# Patient Record
Sex: Female | Born: 1995 | Race: Black or African American | Hispanic: No | Marital: Single | Smoking: Never smoker
Health system: Southern US, Community
[De-identification: ages and names within clinical notes are randomized; demographics above are authoritative.]

---

## 2017-01-28 ENCOUNTER — Ambulatory Visit (INDEPENDENT_AMBULATORY_CARE_PROVIDER_SITE_OTHER): Payer: Medicaid Other | Admitting: Obstetrics and Gynecology

## 2017-01-28 ENCOUNTER — Encounter: Payer: Self-pay | Admitting: Obstetrics

## 2017-01-28 ENCOUNTER — Encounter: Payer: Self-pay | Admitting: Obstetrics and Gynecology

## 2017-01-28 ENCOUNTER — Other Ambulatory Visit (HOSPITAL_COMMUNITY)
Admission: RE | Admit: 2017-01-28 | Discharge: 2017-01-28 | Disposition: A | Discharge status: Home / Self Care | Payer: Medicaid Other | Source: Ambulatory Visit | Attending: Obstetrics and Gynecology | Admitting: Obstetrics and Gynecology

## 2017-01-28 DIAGNOSIS — Z3400 Encounter for supervision of normal first pregnancy, unspecified trimester: Secondary | ICD-10-CM | POA: Diagnosis not present

## 2017-01-28 DIAGNOSIS — Z3402 Encounter for supervision of normal first pregnancy, second trimester: Secondary | ICD-10-CM

## 2017-01-28 NOTE — Patient Instructions (Addendum)
 Second Trimester of Pregnancy The second trimester is from week 14 through week 27 (months 4 through 6). The second trimester is often a time when you feel your best. Your body has adjusted to being pregnant, and you begin to feel better physically. Usually, morning sickness has lessened or quit completely, you may have more energy, and you may have an increase in appetite. The second trimester is also a time when the fetus is growing rapidly. At the end of the sixth month, the fetus is about 9 inches long and weighs about 1 pounds. You will likely begin to feel the baby move (quickening) between 16 and 20 weeks of pregnancy. Body changes during your second trimester Your body continues to go through many changes during your second trimester. The changes vary from woman to woman.  Your weight will continue to increase. You will notice your lower abdomen bulging out.  You may begin to get stretch marks on your hips, abdomen, and breasts.  You may develop headaches that can be relieved by medicines. The medicines should be approved by your health care provider.  You may urinate more often because the fetus is pressing on your bladder.  You may develop or continue to have heartburn as a result of your pregnancy.  You may develop constipation because certain hormones are causing the muscles that push waste through your intestines to slow down.  You may develop hemorrhoids or swollen, bulging veins (varicose veins).  You may have back pain. This is caused by: ? Weight gain. ? Pregnancy hormones that are relaxing the joints in your pelvis. ? A shift in weight and the muscles that support your balance.  Your breasts will continue to grow and they will continue to become tender.  Your gums may bleed and may be sensitive to brushing and flossing.  Dark spots or blotches (chloasma, mask of pregnancy) may develop on your face. This will likely fade after the baby is born.  A dark line from  your belly button to the pubic area (linea nigra) may appear. This will likely fade after the baby is born.  You may have changes in your hair. These can include thickening of your hair, rapid growth, and changes in texture. Some women also have hair loss during or after pregnancy, or hair that feels dry or thin. Your hair will most likely return to normal after your baby is born.  What to expect at prenatal visits During a routine prenatal visit:  You will be weighed to make sure you and the fetus are growing normally.  Your blood pressure will be taken.  Your abdomen will be measured to track your baby's growth.  The fetal heartbeat will be listened to.  Any test results from the previous visit will be discussed.  Your health care provider may ask you:  How you are feeling.  If you are feeling the baby move.  If you have had any abnormal symptoms, such as leaking fluid, bleeding, severe headaches, or abdominal cramping.  If you are using any tobacco products, including cigarettes, chewing tobacco, and electronic cigarettes.  If you have any questions.  Other tests that may be performed during your second trimester include:  Blood tests that check for: ? Low iron levels (anemia). ? High blood sugar that affects pregnant women (gestational diabetes) between 24 and 28 weeks. ? Rh antibodies. This is to check for a protein on red blood cells (Rh factor).  Urine tests to check for infections, diabetes,   or protein in the urine.  An ultrasound to confirm the proper growth and development of the baby.  An amniocentesis to check for possible genetic problems.  Fetal screens for spina bifida and Down syndrome.  HIV (human immunodeficiency virus) testing. Routine prenatal testing includes screening for HIV, unless you choose not to have this test.  Follow these instructions at home: Medicines  Follow your health care provider's instructions regarding medicine use. Specific  medicines may be either safe or unsafe to take during pregnancy.  Take a prenatal vitamin that contains at least 600 micrograms (mcg) of folic acid.  If you develop constipation, try taking a stool softener if your health care provider approves. Eating and drinking  Eat a balanced diet that includes fresh fruits and vegetables, whole grains, good sources of protein such as meat, eggs, or tofu, and low-fat dairy. Your health care provider will help you determine the amount of weight gain that is right for you.  Avoid raw meat and uncooked cheese. These carry germs that can cause birth defects in the baby.  If you have low calcium intake from food, talk to your health care provider about whether you should take a daily calcium supplement.  Limit foods that are high in fat and processed sugars, such as fried and sweet foods.  To prevent constipation: ? Drink enough fluid to keep your urine clear or pale yellow. ? Eat foods that are high in fiber, such as fresh fruits and vegetables, whole grains, and beans. Activity  Exercise only as directed by your health care provider. Most women can continue their usual exercise routine during pregnancy. Try to exercise for 30 minutes at least 5 days a week. Stop exercising if you experience uterine contractions.  Avoid heavy lifting, wear low heel shoes, and practice good posture.  A sexual relationship may be continued unless your health care provider directs you otherwise. Relieving pain and discomfort  Wear a good support bra to prevent discomfort from breast tenderness.  Take warm sitz baths to soothe any pain or discomfort caused by hemorrhoids. Use hemorrhoid cream if your health care provider approves.  Rest with your legs elevated if you have leg cramps or low back pain.  If you develop varicose veins, wear support hose. Elevate your feet for 15 minutes, 3-4 times a day. Limit salt in your diet. Prenatal Care  Write down your questions.  Take them to your prenatal visits.  Keep all your prenatal visits as told by your health care provider. This is important. Safety  Wear your seat belt at all times when driving.  Make a list of emergency phone numbers, including numbers for family, friends, the hospital, and police and fire departments. General instructions  Ask your health care provider for a referral to a local prenatal education class. Begin classes no later than the beginning of month 6 of your pregnancy.  Ask for help if you have counseling or nutritional needs during pregnancy. Your health care provider can offer advice or refer you to specialists for help with various needs.  Do not use hot tubs, steam rooms, or saunas.  Do not douche or use tampons or scented sanitary pads.  Do not cross your legs for long periods of time.  Avoid cat litter boxes and soil used by cats. These carry germs that can cause birth defects in the baby and possibly loss of the fetus by miscarriage or stillbirth.  Avoid all smoking, herbs, alcohol, and unprescribed drugs. Chemicals in these products   can affect the formation and growth of the baby.  Do not use any products that contain nicotine or tobacco, such as cigarettes and e-cigarettes. If you need help quitting, ask your health care provider.  Visit your dentist if you have not gone yet during your pregnancy. Use a soft toothbrush to brush your teeth and be gentle when you floss. Contact a health care provider if:  You have dizziness.  You have mild pelvic cramps, pelvic pressure, or nagging pain in the abdominal area.  You have persistent nausea, vomiting, or diarrhea.  You have a bad smelling vaginal discharge.  You have pain when you urinate. Get help right away if:  You have a fever.  You are leaking fluid from your vagina.  You have spotting or bleeding from your vagina.  You have severe abdominal cramping or pain.  You have rapid weight gain or weight  loss.  You have shortness of breath with chest pain.  You notice sudden or extreme swelling of your face, hands, ankles, feet, or legs.  You have not felt your baby move in over an hour.  You have severe headaches that do not go away when you take medicine.  You have vision changes. Summary  The second trimester is from week 14 through week 27 (months 4 through 6). It is also a time when the fetus is growing rapidly.  Your body goes through many changes during pregnancy. The changes vary from woman to woman.  Avoid all smoking, herbs, alcohol, and unprescribed drugs. These chemicals affect the formation and growth your baby.  Do not use any tobacco products, such as cigarettes, chewing tobacco, and e-cigarettes. If you need help quitting, ask your health care provider.  Contact your health care provider if you have any questions. Keep all prenatal visits as told by your health care provider. This is important. This information is not intended to replace advice given to you by your health care provider. Make sure you discuss any questions you have with your health care provider. Document Released: 09/03/2001 Document Revised: 02/15/2016 Document Reviewed: 11/10/2012 [employer] Interactive Patient Education  2017 [employer]  Contraception Choices Contraception (birth control) is the use of any methods or devices to prevent pregnancy. Below are some methods to help avoid pregnancy. Hormonal methods  Contraceptive implant. This is a thin, plastic tube containing progesterone hormone. It does not contain estrogen hormone. Your health care provider inserts the tube in the inner part of the upper arm. The tube can remain in place for up to 3 years. After 3 years, the implant must be removed. The implant prevents the ovaries from releasing an egg (ovulation), thickens the cervical mucus to prevent sperm from entering the uterus, and thins the lining of the inside of the  uterus.  Progesterone-only injections. These injections are given every 3 months by your health care provider to prevent pregnancy. This synthetic progesterone hormone stops the ovaries from releasing eggs. It also thickens cervical mucus and changes the uterine lining. This makes it harder for sperm to survive in the uterus.  Birth control pills. These pills contain estrogen and progesterone hormone. They work by preventing the ovaries from releasing eggs (ovulation). They also cause the cervical mucus to thicken, preventing the sperm from entering the uterus. Birth control pills are prescribed by a health care provider.Birth control pills can also be used to treat heavy periods.  Minipill. This type of birth control pill contains only the progesterone hormone. They are taken every day   of each month and must be prescribed by your health care provider.  Birth control patch. The patch contains hormones similar to those in birth control pills. It must be changed once a week and is prescribed by a health care provider.  Vaginal ring. The ring contains hormones similar to those in birth control pills. It is left in the vagina for 3 weeks, removed for 1 week, and then a new one is put back in place. The patient must be comfortable inserting and removing the ring from the vagina.A health care provider's prescription is necessary.  Emergency contraception. Emergency contraceptives prevent pregnancy after unprotected sexual intercourse. This pill can be taken right after sex or up to 5 days after unprotected sex. It is most effective the sooner you take the pills after having sexual intercourse. Most emergency contraceptive pills are available without a prescription. Check with your pharmacist. Do not use emergency contraception as your only form of birth control. Barrier methods  Female condom. This is a thin sheath (latex or rubber) that is worn over the penis during sexual intercourse. It can be used with  spermicide to increase effectiveness.  Female condom. This is a soft, loose-fitting sheath that is put into the vagina before sexual intercourse.  Diaphragm. This is a soft, latex, dome-shaped barrier that must be fitted by a health care provider. It is inserted into the vagina, along with a spermicidal jelly. It is inserted before intercourse. The diaphragm should be left in the vagina for 6 to 8 hours after intercourse.  Cervical cap. This is a round, soft, latex or plastic cup that fits over the cervix and must be fitted by a health care provider. The cap can be left in place for up to 48 hours after intercourse.  Sponge. This is a soft, circular piece of polyurethane foam. The sponge has spermicide in it. It is inserted into the vagina after wetting it and before sexual intercourse.  Spermicides. These are chemicals that kill or block sperm from entering the cervix and uterus. They come in the form of creams, jellies, suppositories, foam, or tablets. They do not require a prescription. They are inserted into the vagina with an applicator before having sexual intercourse. The process must be repeated every time you have sexual intercourse. Intrauterine contraception  Intrauterine device (IUD). This is a T-shaped device that is put in a woman's uterus during a menstrual period to prevent pregnancy. There are 2 types: ? Copper IUD. This type of IUD is wrapped in copper wire and is placed inside the uterus. Copper makes the uterus and fallopian tubes produce a fluid that kills sperm. It can stay in place for 10 years. ? Hormone IUD. This type of IUD contains the hormone progestin (synthetic progesterone). The hormone thickens the cervical mucus and prevents sperm from entering the uterus, and it also thins the uterine lining to prevent implantation of a fertilized egg. The hormone can weaken or kill the sperm that get into the uterus. It can stay in place for 3-5 years, depending on which type of IUD  is used. Permanent methods of contraception  Female tubal ligation. This is when the woman's fallopian tubes are surgically sealed, tied, or blocked to prevent the egg from traveling to the uterus.  Hysteroscopic sterilization. This involves placing a small coil or insert into each fallopian tube. Your doctor uses a technique called hysteroscopy to do the procedure. The device causes scar tissue to form. This results in permanent blockage of the fallopian   tubes, so the sperm cannot fertilize the egg. It takes about 3 months after the procedure for the tubes to become blocked. You must use another form of birth control for these 3 months.  Female sterilization. This is when the female has the tubes that carry sperm tied off (vasectomy).This blocks sperm from entering the vagina during sexual intercourse. After the procedure, the man can still ejaculate fluid (semen). Natural planning methods  Natural family planning. This is not having sexual intercourse or using a barrier method (condom, diaphragm, cervical cap) on days the woman could become pregnant.  Calendar method. This is keeping track of the length of each menstrual cycle and identifying when you are fertile.  Ovulation method. This is avoiding sexual intercourse during ovulation.  Symptothermal method. This is avoiding sexual intercourse during ovulation, using a thermometer and ovulation symptoms.  Post-ovulation method. This is timing sexual intercourse after you have ovulated. Regardless of which type or method of contraception you choose, it is important that you use condoms to protect against the transmission of sexually transmitted infections (STIs). Talk with your health care provider about which form of contraception is most appropriate for you. This information is not intended to replace advice given to you by your health care provider. Make sure you discuss any questions you have with your health care provider. Document Released:  09/09/2005 Document Revised: 02/15/2016 Document Reviewed: 03/04/2013 [employer] Interactive Patient Education  2017 [employer]   Breastfeeding Deciding to breastfeed is one of the best choices you can make for you and your baby. A change in hormones during pregnancy causes your breast tissue to grow and increases the number and size of your milk ducts. These hormones also allow proteins, sugars, and fats from your blood supply to make breast milk in your milk-producing glands. Hormones prevent breast milk from being released before your baby is born as well as prompt milk flow after birth. Once breastfeeding has begun, thoughts of your baby, as well as his or her sucking or crying, can stimulate the release of milk from your milk-producing glands. Benefits of breastfeeding For Your Baby  Your first milk (colostrum) helps your baby's digestive system function better.  There are antibodies in your milk that help your baby fight off infections.  Your baby has a lower incidence of asthma, allergies, and sudden infant death syndrome.  The nutrients in breast milk are better for your baby than infant formulas and are designed uniquely for your baby's needs.  Breast milk improves your baby's brain development.  Your baby is less likely to develop other conditions, such as childhood obesity, asthma, or type 2 diabetes mellitus.  For You  Breastfeeding helps to create a very special bond between you and your baby.  Breastfeeding is convenient. Breast milk is always available at the correct temperature and costs nothing.  Breastfeeding helps to burn calories and helps you lose the weight gained during pregnancy.  Breastfeeding makes your uterus contract to its prepregnancy size faster and slows bleeding (lochia) after you give birth.  Breastfeeding helps to lower your risk of developing type 2 diabetes mellitus, osteoporosis, and breast or ovarian cancer later in life.  Signs that your baby  is hungry Early Signs of Hunger  Increased alertness or activity.  Stretching.  Movement of the head from side to side.  Movement of the head and opening of the mouth when the corner of the mouth or cheek is stroked (rooting).  Increased sucking sounds, smacking lips, cooing, sighing, or   squeaking.  Hand-to-mouth movements.  Increased sucking of fingers or hands.  Late Signs of Hunger  Fussing.  Intermittent crying.  Extreme Signs of Hunger Signs of extreme hunger will require calming and consoling before your baby will be able to breastfeed successfully. Do not wait for the following signs of extreme hunger to occur before you initiate breastfeeding:  Restlessness.  A loud, strong cry.  Screaming.  Breastfeeding basics Breastfeeding Initiation  Find a comfortable place to sit or lie down, with your neck and back well supported.  Place a pillow or rolled up blanket under your baby to bring him or her to the level of your breast (if you are seated). Nursing pillows are specially designed to help support your arms and your baby while you breastfeed.  Make sure that your baby's abdomen is facing your abdomen.  Gently massage your breast. With your fingertips, massage from your chest wall toward your nipple in a circular motion. This encourages milk flow. You may need to continue this action during the feeding if your milk flows slowly.  Support your breast with 4 fingers underneath and your thumb above your nipple. Make sure your fingers are well away from your nipple and your baby's mouth.  Stroke your baby's lips gently with your finger or nipple.  When your baby's mouth is open wide enough, quickly bring your baby to your breast, placing your entire nipple and as much of the colored area around your nipple (areola) as possible into your baby's mouth. ? More areola should be visible above your baby's upper lip than below the lower lip. ? Your baby's tongue should be  between his or her lower gum and your breast.  Ensure that your baby's mouth is correctly positioned around your nipple (latched). Your baby's lips should create a seal on your breast and be turned out (everted).  It is common for your baby to suck about 2-3 minutes in order to start the flow of breast milk.  Latching Teaching your baby how to latch on to your breast properly is very important. An improper latch can cause nipple pain and decreased milk supply for you and poor weight gain in your baby. Also, if your baby is not latched onto your nipple properly, he or she may swallow some air during feeding. This can make your baby fussy. Burping your baby when you switch breasts during the feeding can help to get rid of the air. However, teaching your baby to latch on properly is still the best way to prevent fussiness from swallowing air while breastfeeding. Signs that your baby has successfully latched on to your nipple:  Silent tugging or silent sucking, without causing you pain.  Swallowing heard between every 3-4 sucks.  Muscle movement above and in front of his or her ears while sucking.  Signs that your baby has not successfully latched on to nipple:  Sucking sounds or smacking sounds from your baby while breastfeeding.  Nipple pain.  If you think your baby has not latched on correctly, slip your finger into the corner of your baby's mouth to break the suction and place it between your baby's gums. Attempt breastfeeding initiation again. Signs of Successful Breastfeeding Signs from your baby:  A gradual decrease in the number of sucks or complete cessation of sucking.  Falling asleep.  Relaxation of his or her body.  Retention of a small amount of milk in his or her mouth.  Letting go of your breast by himself or herself.    Signs from you:  Breasts that have increased in firmness, weight, and size 1-3 hours after feeding.  Breasts that are softer immediately after  breastfeeding.  Increased milk volume, as well as a change in milk consistency and color by the fifth day of breastfeeding.  Nipples that are not sore, cracked, or bleeding.  Signs That Your Baby is Getting Enough Milk  Wetting at least 1-2 diapers during the first 24 hours after birth.  Wetting at least 5-6 diapers every 24 hours for the first week after birth. The urine should be clear or pale yellow by 5 days after birth.  Wetting 6-8 diapers every 24 hours as your baby continues to grow and develop.  At least 3 stools in a 24-hour period by age 5 days. The stool should be soft and yellow.  At least 3 stools in a 24-hour period by age 7 days. The stool should be seedy and yellow.  No loss of weight greater than 10% of birth weight during the first 3 days of age.  Average weight gain of 4-7 ounces (113-198 g) per week after age 4 days.  Consistent daily weight gain by age 5 days, without weight loss after the age of 2 weeks.  After a feeding, your baby may spit up a small amount. This is common. Breastfeeding frequency and duration Frequent feeding will help you make more milk and can prevent sore nipples and breast engorgement. Breastfeed when you feel the need to reduce the fullness of your breasts or when your baby shows signs of hunger. This is called "breastfeeding on demand." Avoid introducing a pacifier to your baby while you are working to establish breastfeeding (the first 4-6 weeks after your baby is born). After this time you may choose to use a pacifier. Research has shown that pacifier use during the first year of a baby's life decreases the risk of sudden infant death syndrome (SIDS). Allow your baby to feed on each breast as long as he or she wants. Breastfeed until your baby is finished feeding. When your baby unlatches or falls asleep while feeding from the first breast, offer the second breast. Because newborns are often sleepy in the first few weeks of life, you may  need to awaken your baby to get him or her to feed. Breastfeeding times will vary from baby to baby. However, the following rules can serve as a guide to help you ensure that your baby is properly fed:  Newborns (babies 4 weeks of age or younger) may breastfeed every 1-3 hours.  Newborns should not go longer than 3 hours during the day or 5 hours during the night without breastfeeding.  You should breastfeed your baby a minimum of 8 times in a 24-hour period until you begin to introduce solid foods to your baby at around 6 months of age.  Breast milk pumping Pumping and storing breast milk allows you to ensure that your baby is exclusively fed your breast milk, even at times when you are unable to breastfeed. This is especially important if you are going back to work while you are still breastfeeding or when you are not able to be present during feedings. Your lactation consultant can give you guidelines on how long it is safe to store breast milk. A breast pump is a machine that allows you to pump milk from your breast into a sterile bottle. The pumped breast milk can then be stored in a refrigerator or freezer. Some breast pumps are operated by   hand, while others use electricity. Ask your lactation consultant which type will work best for you. Breast pumps can be purchased, but some hospitals and breastfeeding support groups lease breast pumps on a monthly basis. A lactation consultant can teach you how to hand express breast milk, if you prefer not to use a pump. Caring for your breasts while you breastfeed Nipples can become dry, cracked, and sore while breastfeeding. The following recommendations can help keep your breasts moisturized and healthy:  Avoid using soap on your nipples.  Wear a supportive bra. Although not required, special nursing bras and tank tops are designed to allow access to your breasts for breastfeeding without taking off your entire bra or top. Avoid wearing  underwire-style bras or extremely tight bras.  Air dry your nipples for 3-4minutes after each feeding.  Use only cotton bra pads to absorb leaked breast milk. Leaking of breast milk between feedings is normal.  Use lanolin on your nipples after breastfeeding. Lanolin helps to maintain your skin's normal moisture barrier. If you use pure lanolin, you do not need to wash it off before feeding your baby again. Pure lanolin is not toxic to your baby. You may also hand express a few drops of breast milk and gently massage that milk into your nipples and allow the milk to air dry.  In the first few weeks after giving birth, some women experience extremely full breasts (engorgement). Engorgement can make your breasts feel heavy, warm, and tender to the touch. Engorgement peaks within 3-5 days after you give birth. The following recommendations can help ease engorgement:  Completely empty your breasts while breastfeeding or pumping. You may want to start by applying warm, moist heat (in the shower or with warm water-soaked hand towels) just before feeding or pumping. This increases circulation and helps the milk flow. If your baby does not completely empty your breasts while breastfeeding, pump any extra milk after he or she is finished.  Wear a snug bra (nursing or regular) or tank top for 1-2 days to signal your body to slightly decrease milk production.  Apply ice packs to your breasts, unless this is too uncomfortable for you.  Make sure that your baby is latched on and positioned properly while breastfeeding.  If engorgement persists after 48 hours of following these recommendations, contact your health care provider or a lactation consultant. Overall health care recommendations while breastfeeding  Eat healthy foods. Alternate between meals and snacks, eating 3 of each per day. Because what you eat affects your breast milk, some of the foods may make your baby more irritable than usual. Avoid  eating these foods if you are sure that they are negatively affecting your baby.  Drink milk, fruit juice, and water to satisfy your thirst (about 10 glasses a day).  Rest often, relax, and continue to take your prenatal vitamins to prevent fatigue, stress, and anemia.  Continue breast self-awareness checks.  Avoid chewing and smoking tobacco. Chemicals from cigarettes that pass into breast milk and exposure to secondhand smoke may harm your baby.  Avoid alcohol and drug use, including marijuana. Some medicines that may be harmful to your baby can pass through breast milk. It is important to ask your health care provider before taking any medicine, including all over-the-counter and prescription medicine as well as vitamin and herbal supplements. It is possible to become pregnant while breastfeeding. If birth control is desired, ask your health care provider about options that will be safe for your baby. Contact   a health care provider if:  You feel like you want to stop breastfeeding or have become frustrated with breastfeeding.  You have painful breasts or nipples.  Your nipples are cracked or bleeding.  Your breasts are red, tender, or warm.  You have a swollen area on either breast.  You have a fever or chills.  You have nausea or vomiting.  You have drainage other than breast milk from your nipples.  Your breasts do not become full before feedings by the fifth day after you give birth.  You feel sad and depressed.  Your baby is too sleepy to eat well.  Your baby is having trouble sleeping.  Your baby is wetting less than 3 diapers in a 24-hour period.  Your baby has less than 3 stools in a 24-hour period.  Your baby's skin or the white part of his or her eyes becomes yellow.  Your baby is not gaining weight by 5 days of age. Get help right away if:  Your baby is overly tired (lethargic) and does not want to wake up and feed.  Your baby develops an unexplained  fever. This information is not intended to replace advice given to you by your health care provider. Make sure you discuss any questions you have with your health care provider. Document Released: 09/09/2005 Document Revised: 02/21/2016 Document Reviewed: 03/03/2013 Elsevier Interactive Patient Education  2017 [employer]  

## 2017-01-28 NOTE — Progress Notes (Signed)
  Subjective:    Shelby Woods is a G1P0 7139w5d being seen today for her first obstetrical visit.  Her obstetrical history is significant for teen pregnancy. Patient does intend to breast feed. Pregnancy history fully reviewed.  Patient reports no complaints.  Vitals:   01/28/17 1427 01/28/17 1431  BP: 108/67   Pulse: 92   Temp: 98.7 F (37.1 C)   Weight: 116 lb 8 oz (52.8 kg)   Height:  5\' 2"  (1.575 m)    HISTORY: OB History  Gravida Para Term Preterm AB Living  1            SAB TAB Ectopic Multiple Live Births               # Outcome Date GA Lbr Len/2nd Weight Sex Delivery Anes PTL Lv  1 Current              History reviewed. No pertinent past medical history. History reviewed. No pertinent surgical history. History reviewed. No pertinent family history.   Exam    Uterus:     Pelvic Exam:    Perineum: No Hemorrhoids, Normal Perineum   Vulva: normal   Vagina:  normal mucosa, normal discharge   pH:    Cervix: nulliparous appearance and cervix is closed and long   Adnexa: no mass, fullness, tenderness   Bony Pelvis: gynecoid  System: Breast:  normal appearance, no masses or tenderness   Skin: normal coloration and turgor, no rashes    Neurologic: oriented, no focal deficits   Extremities: normal strength, tone, and muscle mass   HEENT extra ocular movement intact   Mouth/Teeth mucous membranes moist, pharynx normal without lesions and dental hygiene good   Neck supple and no masses   Cardiovascular: regular rate and rhythm   Respiratory:  chest clear, no wheezing, crepitations, rhonchi, normal symmetric air entry   Abdomen: soft, non-tender; bowel sounds normal; no masses,  no organomegaly   Urinary:       Assessment:    Pregnancy: G1P0 Patient Active Problem List   Diagnosis Date Noted  . Supervision of normal first teen pregnancy 01/28/2017        Plan:     Initial labs drawn. Prenatal vitamins. Problem list reviewed and updated. Genetic  Screening discussed Quad Screen: requested. Will be collected at next visit  Ultrasound discussed; fetal survey: ordered. Patient works at [employer]. Advised to continue working until told otherwise. Work restriction letter provided  Follow up in 4 weeks. 50% of 30 min visit spent on counseling and coordination of care.     Rahmon Heigl 01/28/2017

## 2017-01-29 LAB — CERVICOVAGINAL ANCILLARY ONLY
BACTERIAL VAGINITIS: NEGATIVE
Candida vaginitis: NEGATIVE
Chlamydia: NEGATIVE
NEISSERIA GONORRHEA: NEGATIVE
Trichomonas: NEGATIVE

## 2017-01-30 LAB — CULTURE, OB URINE

## 2017-01-30 LAB — URINE CULTURE, OB REFLEX

## 2017-02-03 LAB — HEMOGLOBINOPATHY EVALUATION
HGB A: 97.7 % (ref 96.4–98.8)
HGB C: 0 %
HGB S: 0 %
HGB VARIANT: 0 %
Hemoglobin A2 Quantitation: 2.3 % (ref 1.8–3.2)
Hemoglobin F Quantitation: 0 % (ref 0.0–2.0)

## 2017-02-03 LAB — OBSTETRIC PANEL, INCLUDING HIV
Antibody Screen: NEGATIVE
BASOS ABS: 0 10*3/uL (ref 0.0–0.2)
Basos: 0 %
EOS (ABSOLUTE): 0 10*3/uL (ref 0.0–0.4)
Eos: 1 %
HEMOGLOBIN: 13.7 g/dL (ref 11.1–15.9)
HEP B S AG: NEGATIVE
HIV Screen 4th Generation wRfx: NONREACTIVE
Hematocrit: 40.2 % (ref 34.0–46.6)
IMMATURE GRANS (ABS): 0 10*3/uL (ref 0.0–0.1)
IMMATURE GRANULOCYTES: 0 %
LYMPHS ABS: 1.6 10*3/uL (ref 0.7–3.1)
LYMPHS: 26 %
MCH: 33.3 pg — ABNORMAL HIGH (ref 26.6–33.0)
MCHC: 34.1 g/dL (ref 31.5–35.7)
MCV: 98 fL — ABNORMAL HIGH (ref 79–97)
MONOCYTES: 6 %
Monocytes Absolute: 0.3 10*3/uL (ref 0.1–0.9)
NEUTROS PCT: 67 %
Neutrophils Absolute: 4.2 10*3/uL (ref 1.4–7.0)
PLATELETS: 206 10*3/uL (ref 150–379)
RBC: 4.12 x10E6/uL (ref 3.77–5.28)
RDW: 13.1 % (ref 12.3–15.4)
RPR: NONREACTIVE
Rh Factor: POSITIVE
Rubella Antibodies, IGG: 5.66 index (ref >0.99)
WBC: 6.2 10*3/uL (ref 3.4–10.8)

## 2017-02-03 LAB — COMPREHENSIVE METABOLIC PANEL
A/G RATIO: 1.4 (ref 1.2–2.2)
ALBUMIN: 4 g/dL (ref 3.5–5.5)
ALT: 8 IU/L (ref 0–32)
AST: 16 IU/L (ref 0–40)
Alkaline Phosphatase: 46 IU/L (ref 39–117)
BUN / CREAT RATIO: 9 (ref 9–23)
BUN: 5 mg/dL — AB (ref 6–20)
Bilirubin Total: 0.3 mg/dL (ref 0.0–1.2)
CALCIUM: 9.1 mg/dL (ref 8.7–10.2)
CO2: 21 mmol/L (ref 18–29)
Chloride: 99 mmol/L (ref 96–106)
Creatinine, Ser: 0.58 mg/dL (ref 0.57–1.00)
GFR, EST AFRICAN AMERICAN: 155 mL/min/{1.73_m2} (ref >59)
GFR, EST NON AFRICAN AMERICAN: 134 mL/min/{1.73_m2} (ref >59)
GLUCOSE: 74 mg/dL (ref 65–99)
Globulin, Total: 2.8 g/dL (ref 1.5–4.5)
Potassium: 4.4 mmol/L (ref 3.5–5.2)
Sodium: 136 mmol/L (ref 134–144)
TOTAL PROTEIN: 6.8 g/dL (ref 6.0–8.5)

## 2017-02-03 LAB — VARICELLA ZOSTER ANTIBODY, IGG: VARICELLA: 345 {index} (ref >165)

## 2017-02-03 LAB — HEMOGLOBIN A1C
Est. average glucose Bld gHb Est-mCnc: 100 mg/dL
Hgb A1c MFr Bld: 5.1 % (ref 4.8–5.6)

## 2017-02-03 LAB — CYSTIC FIBROSIS MUTATION 97: Interpretation: NOT DETECTED

## 2017-02-25 ENCOUNTER — Ambulatory Visit (INDEPENDENT_AMBULATORY_CARE_PROVIDER_SITE_OTHER): Payer: Self-pay | Admitting: Obstetrics and Gynecology

## 2017-02-25 DIAGNOSIS — Z3400 Encounter for supervision of normal first pregnancy, unspecified trimester: Secondary | ICD-10-CM

## 2017-02-25 DIAGNOSIS — Z3482 Encounter for supervision of other normal pregnancy, second trimester: Secondary | ICD-10-CM

## 2017-02-25 MED ORDER — COMFORT FIT MATERNITY SUPP MED MISC: 0 refills | Status: AC

## 2017-02-25 NOTE — Progress Notes (Signed)
   PRENATAL VISIT NOTE  Subjective:  Shelby Woods is a 21 y.o. G1P0 at [redacted]w[redacted]d being seen today for ongoing prenatal care.  She is currently monitored for the following issues for this low-risk pregnancy and has Supervision of normal first teen pregnancy on her problem list.  Patient reports no complaints.  Contractions: Not present. Vag. Bleeding: None.  Movement: Present. Denies leaking of fluid.   The following portions of the patient's history were reviewed and updated as appropriate: allergies, current medications, past family history, past medical history, past social history, past surgical history and problem list. Problem list updated.  Objective:   Vitals:   02/25/17 1534  BP: 105/67  Pulse: 86  Weight: 116 lb 12.8 oz (53 kg)    Fetal Status: Fetal Heart Rate (bpm): 151   Movement: Present     General:  Alert, oriented and cooperative. Patient is in no acute distress.  Skin: Skin is warm and dry. No rash noted.   Cardiovascular: Normal heart rate noted  Respiratory: Normal respiratory effort, no problems with respiration noted  Abdomen: Soft, gravid, appropriate for gestational age. Pain/Pressure: Absent     Pelvic:  Cervical exam deferred        Extremities: Normal range of motion.  Edema: Trace  Mental Status: Normal mood and affect. Normal behavior. Normal judgment and thought content.   Assessment and Plan:  Pregnancy: G1P0 at 2242w5d  1. Encounter for supervision of normal pregnancy in teen primigravida, antepartum Patient is doing well without complaints She reports some lower back discomfort after working long days. Discussed incorporating Yoga or stretching in her routine. Support belt provided Anatomy ultrasound on 6/7 Quad screen today - AFP TETRA  General obstetric precautions including but not limited to vaginal bleeding, contractions, leaking of fluid and fetal movement were reviewed in detail with the patient. Please refer to After Visit Summary for other  counseling recommendations.  Return in about 4 weeks (around 03/25/2017) for ROB.   Catalina AntiguaPeggy Viney Acocella, MD

## 2017-02-27 ENCOUNTER — Other Ambulatory Visit: Payer: Self-pay | Admitting: Obstetrics and Gynecology

## 2017-02-27 ENCOUNTER — Ambulatory Visit (HOSPITAL_COMMUNITY)
Admission: RE | Admit: 2017-02-27 | Discharge: 2017-02-27 | Disposition: A | Discharge status: Home / Self Care | Payer: Medicaid Other | Source: Ambulatory Visit | Attending: Obstetrics and Gynecology | Admitting: Obstetrics and Gynecology

## 2017-02-27 ENCOUNTER — Encounter (HOSPITAL_COMMUNITY): Payer: Self-pay | Admitting: Radiology

## 2017-02-27 DIAGNOSIS — Z3400 Encounter for supervision of normal first pregnancy, unspecified trimester: Secondary | ICD-10-CM

## 2017-02-27 DIAGNOSIS — O283 Abnormal ultrasonic finding on antenatal screening of mother: Secondary | ICD-10-CM | POA: Insufficient documentation

## 2017-02-27 DIAGNOSIS — Z3A19 19 weeks gestation of pregnancy: Secondary | ICD-10-CM

## 2017-02-27 DIAGNOSIS — Z363 Encounter for antenatal screening for malformations: Secondary | ICD-10-CM | POA: Diagnosis not present

## 2017-02-28 LAB — AFP TETRA
DIA Mom Value: 0.42
DIA VALUE (EIA): 85 pg/mL
DSR (BY AGE) 1 IN: 1166
DSR (SECOND TRIMESTER) 1 IN: 10000
Gestational Age: 18.5 WEEKS
MSAFP MOM: 0.82
MSAFP: 49.1 ng/mL
MSHCG Mom: 0.39
MSHCG: 12104 m[IU]/mL
Maternal Age At EDD: 19.8 yr
Osb Risk: 10000
TEST RESULTS AFP: NEGATIVE
WEIGHT: 116 [lb_av]
uE3 Mom: 1.19
uE3 Value: 1.82 ng/mL

## 2017-03-25 ENCOUNTER — Ambulatory Visit (INDEPENDENT_AMBULATORY_CARE_PROVIDER_SITE_OTHER): Payer: Self-pay | Admitting: Obstetrics and Gynecology

## 2017-03-25 VITALS — BP 96/61 | HR 87 | Wt 120.0 lb

## 2017-03-25 DIAGNOSIS — Z3402 Encounter for supervision of normal first pregnancy, second trimester: Secondary | ICD-10-CM

## 2017-03-25 NOTE — Progress Notes (Signed)
Subjective:  Shelby Woods is a 21 y.o. G2P0010 at 7464w5d being seen today for ongoing prenatal care.  She is currently monitored for the following issues for this low-risk pregnancy and has Supervision of normal first teen pregnancy on her problem list.  Patient reports no complaints.  Contractions: Not present. Vag. Bleeding: None.  Movement: Present. Denies leaking of fluid.   The following portions of the patient's history were reviewed and updated as appropriate: allergies, current medications, past family history, past medical history, past social history, past surgical history and problem list. Problem list updated.  Objective:   Vitals:   03/25/17 1005  BP: 96/61  Pulse: 87  Weight: 120 lb (54.4 kg)    Fetal Status: Fetal Heart Rate (bpm): 150   Movement: Present     General:  Alert, oriented and cooperative. Patient is in no acute distress.  Skin: Skin is warm and dry. No rash noted.   Cardiovascular: Normal heart rate noted  Respiratory: Normal respiratory effort, no problems with respiration noted  Abdomen: Soft, gravid, appropriate for gestational age. Pain/Pressure: Absent     Pelvic:  Cervical exam deferred        Extremities: Normal range of motion.  Edema: None  Mental Status: Normal mood and affect. Normal behavior. Normal judgment and thought content.   Urinalysis:      Assessment and Plan:  Pregnancy: G2P0010 at 5864w5d  1. Supervision of normal first teen pregnancy in second trimester Stable Glucola next visit F/U U/S scheduled  Preterm labor symptoms and general obstetric precautions including but not limited to vaginal bleeding, contractions, leaking of fluid and fetal movement were reviewed in detail with the patient. Please refer to After Visit Summary for other counseling recommendations.  Return in about 4 weeks (around 04/22/2017) for OB visit.   Shelby Woods, Shelby Mazzoni L, MD

## 2017-04-01 ENCOUNTER — Other Ambulatory Visit: Payer: Self-pay | Admitting: Obstetrics and Gynecology

## 2017-04-01 ENCOUNTER — Ambulatory Visit (HOSPITAL_COMMUNITY)
Admission: RE | Admit: 2017-04-01 | Discharge: 2017-04-01 | Disposition: A | Discharge status: Home / Self Care | Payer: Medicaid Other | Source: Ambulatory Visit | Attending: Obstetrics and Gynecology | Admitting: Obstetrics and Gynecology

## 2017-04-01 DIAGNOSIS — Z3686 Encounter for antenatal screening for cervical length: Secondary | ICD-10-CM

## 2017-04-01 DIAGNOSIS — Z3A23 23 weeks gestation of pregnancy: Secondary | ICD-10-CM

## 2017-04-01 DIAGNOSIS — Z0489 Encounter for examination and observation for other specified reasons: Secondary | ICD-10-CM

## 2017-04-01 DIAGNOSIS — IMO0002 Reserved for concepts with insufficient information to code with codable children: Secondary | ICD-10-CM

## 2017-04-01 DIAGNOSIS — Z3400 Encounter for supervision of normal first pregnancy, unspecified trimester: Secondary | ICD-10-CM

## 2017-04-01 DIAGNOSIS — Z362 Encounter for other antenatal screening follow-up: Secondary | ICD-10-CM | POA: Diagnosis present

## 2017-04-22 ENCOUNTER — Other Ambulatory Visit: Payer: Medicaid Other

## 2017-04-22 ENCOUNTER — Ambulatory Visit (INDEPENDENT_AMBULATORY_CARE_PROVIDER_SITE_OTHER): Payer: Self-pay | Admitting: Obstetrics and Gynecology

## 2017-04-22 DIAGNOSIS — O358XX0 Maternal care for other (suspected) fetal abnormality and damage, not applicable or unspecified: Secondary | ICD-10-CM

## 2017-04-22 DIAGNOSIS — Z3400 Encounter for supervision of normal first pregnancy, unspecified trimester: Secondary | ICD-10-CM

## 2017-04-22 DIAGNOSIS — Z3482 Encounter for supervision of other normal pregnancy, second trimester: Secondary | ICD-10-CM

## 2017-04-22 DIAGNOSIS — IMO0002 Reserved for concepts with insufficient information to code with codable children: Secondary | ICD-10-CM | POA: Insufficient documentation

## 2017-04-22 DIAGNOSIS — IMO0001 Reserved for inherently not codable concepts without codable children: Secondary | ICD-10-CM

## 2017-04-22 NOTE — Progress Notes (Signed)
OB f/u US scheduled for August 17th @ 1015.  Pt notified.

## 2017-04-22 NOTE — Addendum Note (Signed)
Addended by: Faythe CasaBELLAMY, JEANETTA M on: 04/22/2017 09:21 AM   Modules accepted: Orders

## 2017-04-22 NOTE — Progress Notes (Signed)
Pt presents for 2 gtt; declines tdap today.

## 2017-04-22 NOTE — Patient Instructions (Signed)

## 2017-04-22 NOTE — Progress Notes (Signed)
Subjective:  Shelby Woods is a 21 y.o. G2P0010 at 837w5d being seen today for ongoing prenatal care.  She is currently monitored for the following issues for this low-risk pregnancy and has Supervision of normal first teen pregnancy and Fetal cardiac echogenic focus on her problem list.  Patient reports no complaints.  Contractions: Irregular. Vag. Bleeding: None.  Movement: Present. Denies leaking of fluid.   The following portions of the patient's history were reviewed and updated as appropriate: allergies, current medications, past family history, past medical history, past social history, past surgical history and problem list. Problem list updated.  Objective:   Vitals:   04/22/17 0856  BP: 107/67  Pulse: 86  Weight: 125 lb (56.7 kg)    Fetal Status: Fetal Heart Rate (bpm): 130   Movement: Present     General:  Alert, oriented and cooperative. Patient is in no acute distress.  Skin: Skin is warm and dry. No rash noted.   Cardiovascular: Normal heart rate noted  Respiratory: Normal respiratory effort, no problems with respiration noted  Abdomen: Soft, gravid, appropriate for gestational age. Pain/Pressure: Absent     Pelvic:  Cervical exam deferred        Extremities: Normal range of motion.  Edema: None  Mental Status: Normal mood and affect. Normal behavior. Normal judgment and thought content.   Urinalysis:      Assessment and Plan:  Pregnancy: G2P0010 at 657w5d  1. Encounter for supervision of normal pregnancy in teen primigravida, antepartum Stable - Glucose Tolerance, 2 Hours w/1 Hour - CBC - HIV antibody - RPR - US MFM OB COMP + 14 WK; Future  2. Fetal cardiac echogenic focus, single or unspecified fetus Nl Quad screen F/U U/S scheduled  Preterm labor symptoms and general obstetric precautions including but not limited to vaginal bleeding, contractions, leaking of fluid and fetal movement were reviewed in detail with the patient. Please refer to After Visit  Summary for other counseling recommendations.  Return in about 3 weeks (around 05/13/2017) for OB visit.   Hermina StaggersErvin, Taressa Rauh L, MD

## 2017-04-23 LAB — GLUCOSE TOLERANCE, 2 HOURS W/ 1HR
GLUCOSE, 1 HOUR: 108 mg/dL (ref 65–179)
GLUCOSE, FASTING: 77 mg/dL (ref 65–91)
Glucose, 2 hour: 100 mg/dL (ref 65–152)

## 2017-04-23 LAB — CBC
Hematocrit: 34.9 % (ref 34.0–46.6)
Hemoglobin: 12 g/dL (ref 11.1–15.9)
MCH: 33.5 pg — AB (ref 26.6–33.0)
MCHC: 34.4 g/dL (ref 31.5–35.7)
MCV: 98 fL — AB (ref 79–97)
PLATELETS: 192 10*3/uL (ref 150–379)
RBC: 3.58 x10E6/uL — AB (ref 3.77–5.28)
RDW: 13.3 % (ref 12.3–15.4)
WBC: 7 10*3/uL (ref 3.4–10.8)

## 2017-04-23 LAB — RPR: RPR Ser Ql: NONREACTIVE

## 2017-04-23 LAB — HIV ANTIBODY (ROUTINE TESTING W REFLEX): HIV SCREEN 4TH GENERATION: NONREACTIVE

## 2017-04-30 ENCOUNTER — Encounter: Payer: Self-pay | Admitting: Obstetrics and Gynecology

## 2017-05-09 ENCOUNTER — Ambulatory Visit (HOSPITAL_COMMUNITY)
Admission: RE | Admit: 2017-05-09 | Discharge: 2017-05-09 | Disposition: A | Discharge status: Home / Self Care | Payer: Medicaid Other | Source: Ambulatory Visit | Attending: Obstetrics and Gynecology | Admitting: Obstetrics and Gynecology

## 2017-05-09 ENCOUNTER — Other Ambulatory Visit: Payer: Self-pay | Admitting: Obstetrics and Gynecology

## 2017-05-09 DIAGNOSIS — O358XX0 Maternal care for other (suspected) fetal abnormality and damage, not applicable or unspecified: Secondary | ICD-10-CM

## 2017-05-09 DIAGNOSIS — Z362 Encounter for other antenatal screening follow-up: Secondary | ICD-10-CM

## 2017-05-09 DIAGNOSIS — Z3A29 29 weeks gestation of pregnancy: Secondary | ICD-10-CM | POA: Diagnosis not present

## 2017-05-09 DIAGNOSIS — IMO0001 Reserved for inherently not codable concepts without codable children: Secondary | ICD-10-CM

## 2017-05-09 DIAGNOSIS — Z3400 Encounter for supervision of normal first pregnancy, unspecified trimester: Secondary | ICD-10-CM

## 2017-05-19 ENCOUNTER — Ambulatory Visit (INDEPENDENT_AMBULATORY_CARE_PROVIDER_SITE_OTHER): Payer: Medicaid Other | Admitting: Advanced Practice Midwife

## 2017-05-19 ENCOUNTER — Encounter: Payer: Self-pay | Admitting: Advanced Practice Midwife

## 2017-05-19 DIAGNOSIS — Z3400 Encounter for supervision of normal first pregnancy, unspecified trimester: Secondary | ICD-10-CM

## 2017-05-19 NOTE — Patient Instructions (Signed)
Third Trimester of Pregnancy The third trimester is from week 28 through week 40 (months 7 through 9). The third trimester is a time when the unborn baby (fetus) is growing rapidly. At the end of the ninth month, the fetus is about 20 inches in length and weighs 6-10 pounds. Body changes during your third trimester Your body will continue to go through many changes during pregnancy. The changes vary from woman to woman. During the third trimester:  Your weight will continue to increase. You can expect to gain 25-35 pounds (11-16 kg) by the end of the pregnancy.  You may begin to get stretch marks on your hips, abdomen, and breasts.  You may urinate more often because the fetus is moving lower into your pelvis and pressing on your bladder.  You may develop or continue to have heartburn. This is caused by increased hormones that slow down muscles in the digestive tract.  You may develop or continue to have constipation because increased hormones slow digestion and cause the muscles that push waste through your intestines to relax.  You may develop hemorrhoids. These are swollen veins (varicose veins) in the rectum that can itch or be painful.  You may develop swollen, bulging veins (varicose veins) in your legs.  You may have increased body aches in the pelvis, back, or thighs. This is due to weight gain and increased hormones that are relaxing your joints.  You may have changes in your hair. These can include thickening of your hair, rapid growth, and changes in texture. Some women also have hair loss during or after pregnancy, or hair that feels dry or thin. Your hair will most likely return to normal after your baby is born.  Your breasts will continue to grow and they will continue to become tender. A yellow fluid (colostrum) may leak from your breasts. This is the first milk you are producing for your baby.  Your belly button may stick out.  You may notice more swelling in your hands,  face, or ankles.  You may have increased tingling or numbness in your hands, arms, and legs. The skin on your belly may also feel numb.  You may feel short of breath because of your expanding uterus.  You may have more problems sleeping. This can be caused by the size of your belly, increased need to urinate, and an increase in your body's metabolism.  You may notice the fetus "dropping," or moving lower in your abdomen (lightening).  You may have increased vaginal discharge.  You may notice your joints feel loose and you may have pain around your pelvic bone.  What to expect at prenatal visits You will have prenatal exams every 2 weeks until week 36. Then you will have weekly prenatal exams. During a routine prenatal visit:  You will be weighed to make sure you and the baby are growing normally.  Your blood pressure will be taken.  Your abdomen will be measured to track your baby's growth.  The fetal heartbeat will be listened to.  Any test results from the previous visit will be discussed.  You may have a cervical check near your due date to see if your cervix has softened or thinned (effaced).  You will be tested for Group B streptococcus. This happens between 35 and 37 weeks.  Your health care provider may ask you:  What your birth plan is.  How you are feeling.  If you are feeling the baby move.  If you have had   any abnormal symptoms, such as leaking fluid, bleeding, severe headaches, or abdominal cramping.  If you are using any tobacco products, including cigarettes, chewing tobacco, and electronic cigarettes.  If you have any questions.  Other tests or screenings that may be performed during your third trimester include:  Blood tests that check for low iron levels (anemia).  Fetal testing to check the health, activity level, and growth of the fetus. Testing is done if you have certain medical conditions or if there are problems during the  pregnancy.  Nonstress test (NST). This test checks the health of your baby to make sure there are no signs of problems, such as the baby not getting enough oxygen. During this test, a belt is placed around your belly. The baby is made to move, and its heart rate is monitored during movement.  What is false labor? False labor is a condition in which you feel small, irregular tightenings of the muscles in the womb (contractions) that usually go away with rest, changing position, or drinking water. These are called Braxton Hicks contractions. Contractions may last for hours, days, or even weeks before true labor sets in. If contractions come at regular intervals, become more frequent, increase in intensity, or become painful, you should see your health care provider. What are the signs of labor?  Abdominal cramps.  Regular contractions that start at 10 minutes apart and become stronger and more frequent with time.  Contractions that start on the top of the uterus and spread down to the lower abdomen and back.  Increased pelvic pressure and dull back pain.  A watery or bloody mucus discharge that comes from the vagina.  Leaking of amniotic fluid. This is also known as your "water breaking." It could be a slow trickle or a gush. Let your health care provider know if it has a color or strange odor. If you have any of these signs, call your health care provider right away, even if it is before your due date. Follow these instructions at home: Medicines  Follow your health care provider's instructions regarding medicine use. Specific medicines may be either safe or unsafe to take during pregnancy.  Take a prenatal vitamin that contains at least 600 micrograms (mcg) of folic acid.  If you develop constipation, try taking a stool softener if your health care provider approves. Eating and drinking  Eat a balanced diet that includes fresh fruits and vegetables, whole grains, good sources of protein  such as meat, eggs, or tofu, and low-fat dairy. Your health care provider will help you determine the amount of weight gain that is right for you.  Avoid raw meat and uncooked cheese. These carry germs that can cause birth defects in the baby.  If you have low calcium intake from food, talk to your health care provider about whether you should take a daily calcium supplement.  Eat four or five small meals rather than three large meals a day.  Limit foods that are high in fat and processed sugars, such as fried and sweet foods.  To prevent constipation: ? Drink enough fluid to keep your urine clear or pale yellow. ? Eat foods that are high in fiber, such as fresh fruits and vegetables, whole grains, and beans. Activity  Exercise only as directed by your health care provider. Most women can continue their usual exercise routine during pregnancy. Try to exercise for 30 minutes at least 5 days a week. Stop exercising if you experience uterine contractions.  Avoid heavy   lifting.  Do not exercise in extreme heat or humidity, or at high altitudes.  Wear low-heel, comfortable shoes.  Practice good posture.  You may continue to have sex unless your health care provider tells you otherwise. Relieving pain and discomfort  Take frequent breaks and rest with your legs elevated if you have leg cramps or low back pain.  Take warm sitz baths to soothe any pain or discomfort caused by hemorrhoids. Use hemorrhoid cream if your health care provider approves.  Wear a good support bra to prevent discomfort from breast tenderness.  If you develop varicose veins: ? Wear support pantyhose or compression stockings as told by your healthcare provider. ? Elevate your feet for 15 minutes, 3-4 times a day. Prenatal care  Write down your questions. Take them to your prenatal visits.  Keep all your prenatal visits as told by your health care provider. This is important. Safety  Wear your seat belt at  all times when driving.  Make a list of emergency phone numbers, including numbers for family, friends, the hospital, and police and fire departments. General instructions  Avoid cat litter boxes and soil used by cats. These carry germs that can cause birth defects in the baby. If you have a cat, ask someone to clean the litter box for you.  Do not travel far distances unless it is absolutely necessary and only with the approval of your health care provider.  Do not use hot tubs, steam rooms, or saunas.  Do not drink alcohol.  Do not use any products that contain nicotine or tobacco, such as cigarettes and e-cigarettes. If you need help quitting, ask your health care provider.  Do not use any medicinal herbs or unprescribed drugs. These chemicals affect the formation and growth of the baby.  Do not douche or use tampons or scented sanitary pads.  Do not cross your legs for long periods of time.  To prepare for the arrival of your baby: ? Take prenatal classes to understand, practice, and ask questions about labor and delivery. ? Make a trial run to the hospital. ? Visit the hospital and tour the maternity area. ? Arrange for maternity or paternity leave through employers. ? Arrange for family and friends to take care of pets while you are in the hospital. ? Purchase a rear-facing car seat and make sure you know how to install it in your car. ? Pack your hospital bag. ? Prepare the baby's nursery. Make sure to remove all pillows and stuffed animals from the baby's crib to prevent suffocation.  Visit your dentist if you have not gone during your pregnancy. Use a soft toothbrush to brush your teeth and be gentle when you floss. Contact a health care provider if:  You are unsure if you are in labor or if your water has broken.  You become dizzy.  You have mild pelvic cramps, pelvic pressure, or nagging pain in your abdominal area.  You have lower back pain.  You have persistent  nausea, vomiting, or diarrhea.  You have an unusual or bad smelling vaginal discharge.  You have pain when you urinate. Get help right away if:  Your water breaks before 37 weeks.  You have regular contractions less than 5 minutes apart before 37 weeks.  You have a fever.  You are leaking fluid from your vagina.  You have spotting or bleeding from your vagina.  You have severe abdominal pain or cramping.  You have rapid weight loss or weight gain.    You have shortness of breath with chest pain.  You notice sudden or extreme swelling of your face, hands, ankles, feet, or legs.  Your baby makes fewer than 10 movements in 2 hours.  You have severe headaches that do not go away when you take medicine.  You have vision changes. Summary  The third trimester is from week 28 through week 40, months 7 through 9. The third trimester is a time when the unborn baby (fetus) is growing rapidly.  During the third trimester, your discomfort may increase as you and your baby continue to gain weight. You may have abdominal, leg, and back pain, sleeping problems, and an increased need to urinate.  During the third trimester your breasts will keep growing and they will continue to become tender. A yellow fluid (colostrum) may leak from your breasts. This is the first milk you are producing for your baby.  False labor is a condition in which you feel small, irregular tightenings of the muscles in the womb (contractions) that eventually go away. These are called Braxton Hicks contractions. Contractions may last for hours, days, or even weeks before true labor sets in.  Signs of labor can include: abdominal cramps; regular contractions that start at 10 minutes apart and become stronger and more frequent with time; watery or bloody mucus discharge that comes from the vagina; increased pelvic pressure and dull back pain; and leaking of amniotic fluid. This information is not intended to replace advice  given to you by your health care provider. Make sure you discuss any questions you have with your health care provider. Document Released: 09/03/2001 Document Revised: 02/15/2016 Document Reviewed: 11/10/2012 Elsevier Interactive Patient Education  2017 Elsevier Inc.  

## 2017-05-19 NOTE — Progress Notes (Signed)
   PRENATAL VISIT NOTE  Subjective:  Shelby Woods is a 21 y.o. G2P0010 at [redacted]w[redacted]d being seen today for ongoing prenatal care.  She is currently monitored for the following issues for this low-risk pregnancy and has Supervision of normal first teen pregnancy and Fetal cardiac echogenic focus on her problem list.  Patient reports no complaints.  Contractions: Not present. Vag. Bleeding: None.  Movement: Present. Denies leaking of fluid.   The following portions of the patient's history were reviewed and updated as appropriate: allergies, current medications, past family history, past medical history, past social history, past surgical history and problem list. Problem list updated.  Objective:   Vitals:   05/19/17 1322  BP: 106/65  Pulse: (!) 102  Weight: 125 lb 12.8 oz (57.1 kg)    Fetal Status:     Movement: Present     General:  Alert, oriented and cooperative. Patient is in no acute distress.  Skin: Skin is warm and dry. No rash noted.   Cardiovascular: Normal heart rate noted  Respiratory: Normal respiratory effort, no problems with respiration noted  Abdomen: Soft, gravid, appropriate for gestational age.  Pain/Pressure: Absent     Pelvic: Cervical exam deferred        Extremities: Normal range of motion.  Edema: None  Mental Status:  Normal mood and affect. Normal behavior. Normal judgment and thought content.   Assessment and Plan:  Pregnancy: G2P0010 at [redacted]w[redacted]d  1. Encounter for supervision of normal pregnancy in teen primigravida, antepartum      Reviewed fetal movement  Preterm labor symptoms and general obstetric precautions including but not limited to vaginal bleeding, contractions, leaking of fluid and fetal movement were reviewed in detail with the patient. Please refer to After Visit Summary for other counseling recommendations.  Return in about 2 weeks (around 06/02/2017).   Wynelle Bourgeois, CNM

## 2017-05-28 ENCOUNTER — Encounter: Payer: Self-pay | Admitting: *Deleted

## 2017-06-02 ENCOUNTER — Ambulatory Visit (INDEPENDENT_AMBULATORY_CARE_PROVIDER_SITE_OTHER): Payer: Medicaid Other | Admitting: Certified Nurse Midwife

## 2017-06-02 VITALS — BP 100/66 | HR 82 | Wt 126.0 lb

## 2017-06-02 DIAGNOSIS — Z3400 Encounter for supervision of normal first pregnancy, unspecified trimester: Secondary | ICD-10-CM

## 2017-06-02 NOTE — Patient Instructions (Addendum)
AREA PEDIATRIC/FAMILY PRACTICE PHYSICIANS  [facility] [address], [address] [place], [place]  [address] Phone - [phone]   Fax - [phone]  [facility] [address] [place], [place] [address] Phone - [phone]   Fax - [phone]  [facility] [address] [place], [place]  [address] Phone - [phone]   Fax - [phone]  [facility] [address] [place], [place]  [address] Phone - [phone]   Fax - [phone]  [facility] [address] [place], [place]  [address] Phone - [phone]   Fax - [phone]  [facility] [address] [place], [place]  [address] Phone - [phone]   Fax - [phone]  [facility] [address] [place], [place]  [address] Phone - [phone]   Fax - [phone]  [facility] [address] [place], [place]  [address] Phone - [phone]   Fax - [phone]  [facility] [address] [place], [place]  [address] Phone - [phone]   Fax - [phone] [facility] [address] [place], [place]  [address] Phone - [phone]   Fax - [phone]  [facility] [address] [place], [place]  [address] Phone - [phone]   Fax - [phone]  [facility] [address] [place], [place]  [address] Phone - [phone]   Fax - [phone]  [facility] [address] [place], [place]  [address] Phone - [phone]   Fax - [phone]  [facility] [address] [place], [place]  [address] Phone - [phone]  [facility] [address] [place], [place]  [address] Phone - [phone]   Fax - [phone]  [facility] [address] [place], [place]  [address] Phone - [phone]   Fax - [phone]  [facility] [address] [place], [place]  [address] Phone - [phone]   Fax - [phone]  [facility] [address] [place], [place]  [address] Phone - [phone]   Fax - [phone]   [facility] [address] [place], [place]  [address] Phone - [phone]   Fax - [phone]  [facility] [address] [place], [place]  [address] Phone - [phone]   Fax - [phone]  [facility] [address] [place], [place]  [address] Phone - [phone]   Fax - [phone]  [facility] [address] [place], [place]  [address] Phone - [phone]   Fax - [phone]  [facility] [address] [place], [place]  [address] Phone - [phone]   Fax - [phone] [facility] [address] [place], [place]  [address] Phone - [phone]   Fax - [phone]  [facility] [address] [place], [place]  [address] Phone - [phone]   Fax - [phone]  [facility] [address] [place], [place]  [address] Phone - [phone]   Fax - [phone]  [facility] Charlene Flemming MD [address] [place] [address] Phone [phone]  Fax [phone]  Contraception Choices Contraception (birth control) is the use of any methods or devices to prevent   pregnancy. Below are some methods to help avoid pregnancy. Hormonal methods  Contraceptive implant. This is a thin, plastic tube containing progesterone hormone. It does not contain estrogen hormone. Your health care provider inserts the tube in the inner part of the upper arm. The tube can remain in place for up to 3 years. After 3 years, the implant must be removed. The implant prevents the  ovaries from releasing an egg (ovulation), thickens the cervical mucus to prevent sperm from entering the uterus, and thins the lining of the inside of the uterus.  Progesterone-only injections. These injections are given every 3 months by your health care provider to prevent pregnancy. This synthetic progesterone hormone stops the ovaries from releasing eggs. It also thickens cervical mucus and changes the uterine lining. This makes it harder for sperm to survive in the uterus.  Birth control pills. These pills contain estrogen and progesterone hormone. They work by preventing the ovaries from releasing eggs (ovulation). They also cause the cervical mucus to thicken, preventing the sperm from entering the uterus. Birth control pills are prescribed by a health care provider.Birth control pills can also be used to treat heavy periods.  Minipill. This type of birth control pill contains only the progesterone hormone. They are taken every day of each month and must be prescribed by your health care provider.  Birth control patch. The patch contains hormones similar to those in birth control pills. It must be changed once a week and is prescribed by a health care provider.  Vaginal ring. The ring contains hormones similar to those in birth control pills. It is left in the vagina for 3 weeks, removed for 1 week, and then a new one is put back in place. The patient must be comfortable inserting and removing the ring from the vagina.A health care provider's prescription is necessary.  Emergency contraception. Emergency contraceptives prevent pregnancy after unprotected sexual intercourse. This pill can be taken right after sex or up to 5 days after unprotected sex. It is most effective the sooner you take the pills after having sexual intercourse. Most emergency contraceptive pills are available without a prescription. Check with your pharmacist. Do not use emergency contraception as your only form of birth  control. Barrier methods  Female condom. This is a thin sheath (latex or rubber) that is worn over the penis during sexual intercourse. It can be used with spermicide to increase effectiveness.  Female condom. This is a soft, loose-fitting sheath that is put into the vagina before sexual intercourse.  Diaphragm. This is a soft, latex, dome-shaped barrier that must be fitted by a health care provider. It is inserted into the vagina, along with a spermicidal jelly. It is inserted before intercourse. The diaphragm should be left in the vagina for 6 to 8 hours after intercourse.  Cervical cap. This is a round, soft, latex or plastic cup that fits over the cervix and must be fitted by a health care provider. The cap can be left in place for up to 48 hours after intercourse.  Sponge. This is a soft, circular piece of polyurethane foam. The sponge has spermicide in it. It is inserted into the vagina after wetting it and before sexual intercourse.  Spermicides. These are chemicals that kill or block sperm from entering the cervix and uterus. They come in the form of creams, jellies, suppositories, foam, or tablets. They do not require a prescription. They are inserted into the vagina with an applicator before having sexual intercourse. The process   must be repeated every time you have sexual intercourse. Intrauterine contraception  Intrauterine device (IUD). This is a T-shaped device that is put in a woman's uterus during a menstrual period to prevent pregnancy. There are 2 types: ? Copper IUD. This type of IUD is wrapped in copper wire and is placed inside the uterus. Copper makes the uterus and fallopian tubes produce a fluid that kills sperm. It can stay in place for 10 years. ? Hormone IUD. This type of IUD contains the hormone progestin (synthetic progesterone). The hormone thickens the cervical mucus and prevents sperm from entering the uterus, and it also thins the uterine lining to prevent  implantation of a fertilized egg. The hormone can weaken or kill the sperm that get into the uterus. It can stay in place for 3-5 years, depending on which type of IUD is used. Permanent methods of contraception  Female tubal ligation. This is when the woman's fallopian tubes are surgically sealed, tied, or blocked to prevent the egg from traveling to the uterus.  Hysteroscopic sterilization. This involves placing a small coil or insert into each fallopian tube. Your doctor uses a technique called hysteroscopy to do the procedure. The device causes scar tissue to form. This results in permanent blockage of the fallopian tubes, so the sperm cannot fertilize the egg. It takes about 3 months after the procedure for the tubes to become blocked. You must use another form of birth control for these 3 months.  Female sterilization. This is when the female has the tubes that carry sperm tied off (vasectomy).This blocks sperm from entering the vagina during sexual intercourse. After the procedure, the man can still ejaculate fluid (semen). Natural planning methods  Natural family planning. This is not having sexual intercourse or using a barrier method (condom, diaphragm, cervical cap) on days the woman could become pregnant.  Calendar method. This is keeping track of the length of each menstrual cycle and identifying when you are fertile.  Ovulation method. This is avoiding sexual intercourse during ovulation.  Symptothermal method. This is avoiding sexual intercourse during ovulation, using a thermometer and ovulation symptoms.  Post-ovulation method. This is timing sexual intercourse after you have ovulated. Regardless of which type or method of contraception you choose, it is important that you use condoms to protect against the transmission of sexually transmitted infections (STIs). Talk with your health care provider about which form of contraception is most appropriate for you. This information is not  intended to replace advice given to you by your health care provider. Make sure you discuss any questions you have with your health care provider. Document Released: 09/09/2005 Document Revised: 02/15/2016 Document Reviewed: 03/04/2013 Elsevier Interactive Patient Education  2017 [employer] Inc.  

## 2017-06-02 NOTE — Progress Notes (Signed)
   PRENATAL VISIT NOTE  Subjective:  Shelby Woods is a 21 y.o. G2P0010 at 6952w4d being seen today for ongoing prenatal care.  She is currently monitored for the following issues for this low-risk pregnancy and has Supervision of normal first teen pregnancy and Fetal cardiac echogenic focus on her problem list.  Patient reports no complaints.  Contractions: Not present. Vag. Bleeding: None.  Movement: Present. Denies leaking of fluid.   The following portions of the patient's history were reviewed and updated as appropriate: allergies, current medications, past family history, past medical history, past social history, past surgical history and problem list. Problem list updated.  Objective:   Vitals:   06/02/17 1411  BP: 100/66  Pulse: 82  Weight: 126 lb (57.2 kg)    Fetal Status: Fetal Heart Rate (bpm): 142; doppler Fundal Height: 32 cm Movement: Present     General:  Alert, oriented and cooperative. Patient is in no acute distress.  Skin: Skin is warm and dry. No rash noted.   Cardiovascular: Normal heart rate noted  Respiratory: Normal respiratory effort, no problems with respiration noted  Abdomen: Soft, gravid, appropriate for gestational age.  Pain/Pressure: Absent     Pelvic: Cervical exam deferred        Extremities: Normal range of motion.  Edema: None  Mental Status:  Normal mood and affect. Normal behavior. Normal judgment and thought content.   Assessment and Plan:  Pregnancy: G2P0010 at 6452w4d  1. Encounter for supervision of normal pregnancy in teen primigravida, antepartum     Doing well, starts birthing classes 06/03/17.    Preterm labor symptoms and general obstetric precautions including but not limited to vaginal bleeding, contractions, leaking of fluid and fetal movement were reviewed in detail with the patient. Please refer to After Visit Summary for other counseling recommendations.  Return in about 2 weeks (around 06/16/2017) for ROB.   Roe Coombsachelle A Donna Silverman,  CNM

## 2017-06-16 ENCOUNTER — Other Ambulatory Visit (HOSPITAL_COMMUNITY)
Admission: RE | Admit: 2017-06-16 | Discharge: 2017-06-16 | Disposition: A | Discharge status: Home / Self Care | Payer: Medicaid Other | Source: Ambulatory Visit | Attending: Certified Nurse Midwife | Admitting: Certified Nurse Midwife

## 2017-06-16 ENCOUNTER — Ambulatory Visit (INDEPENDENT_AMBULATORY_CARE_PROVIDER_SITE_OTHER): Payer: Medicaid Other | Admitting: Certified Nurse Midwife

## 2017-06-16 VITALS — BP 113/75 | HR 88 | Wt 130.4 lb

## 2017-06-16 DIAGNOSIS — B373 Candidiasis of vulva and vagina: Secondary | ICD-10-CM | POA: Insufficient documentation

## 2017-06-16 DIAGNOSIS — Z3400 Encounter for supervision of normal first pregnancy, unspecified trimester: Secondary | ICD-10-CM | POA: Insufficient documentation

## 2017-06-16 DIAGNOSIS — B3731 Acute candidiasis of vulva and vagina: Secondary | ICD-10-CM

## 2017-06-16 DIAGNOSIS — Z3483 Encounter for supervision of other normal pregnancy, third trimester: Secondary | ICD-10-CM

## 2017-06-16 MED ORDER — FLUCONAZOLE 150 MG PO TABS: 150.0000 mg | ORAL_TABLET | Freq: Once | ORAL | 0 refills | Status: AC

## 2017-06-16 NOTE — Patient Instructions (Addendum)
AREA PEDIATRIC/FAMILY PRACTICE PHYSICIANS  [facility] [address], [address] [place], [place]  [address] Phone - [phone]   Fax - [phone]  [facility] [address] [place], [place] [address] Phone - [phone]   Fax - [phone]  [facility] [address] [place], [place]  [address] Phone - [phone]   Fax - [phone]  [facility] [address] [place], [place]  [address] Phone - [phone]   Fax - [phone]  [facility] [address] [place], [place]  [address] Phone - [phone]   Fax - [phone]  [facility] [address], [address] [place], [place]  [address] Phone - [phone]   Fax - [phone]  [facility] [address] [place], [place]  [address] Phone - [phone]   Fax - [phone]  [facility] [address], [address] [place], [place]  [address] Phone - [phone]   Fax - [phone]  [facility] [address] [place], [place]  [address] Phone - [phone]   Fax - [phone] [facility] [address] [place], [place]  [address] Phone - [phone]   Fax - [phone]  [facility] [address] [place], [place]  [address] Phone - [phone]   Fax - [phone]  [facility] [address] [place], [place]  [address] Phone - [phone]   Fax - [phone]  [facility] [address] [place], [place]  [address] Phone - [phone]   Fax - [phone]  [facility]IL[facility] GOSRANI [address] [place], [place]  [address] Phone - [phone]  [facility] [address] [place], [place]  [address] Phone - [phone]   Fax - [phone]  [facility] [address] [place], [place]  [address] Phone - [phone]   Fax - [phone]  [facility] [address] [place], [place]  [address] Phone - [phone]   Fax - [phone]  [facility] [address] [place], [place]  [address] Phone - [phone]   Fax - [phone]   [facility] [address] [place], [place]  [address] Phone - [phone]   Fax - [phone]  [facility] [address] [place], [place]  [address] Phone - [phone]   Fax - [phone]  DAVID RUBIN [address] , [place]  [address] Phone - [phone]   Fax - [phone]  [facility] [address] , [place]  [address] Phone - [phone]   Fax - [phone]  [facility] [address] , [place]  [address] Phone - [phone]   Fax - [phone] [facility] [address] [place], [place]  [address] Phone - [phone]   Fax - [phone]  [facility] [address] [place], [place]  [address] Phone - [phone]   Fax - [phone]  [facility] [address] [place],   [address] Phone - [phone]   Fax - [phone]  [facility] Charlene Flemming MD [address] [place]  [address] Phone [phone]  Fax [phone]  Contraception Choices Contraception (birth control) is the use of any methods or devices to prevent   pregnancy. Below are some methods to help avoid pregnancy. Hormonal methods  Contraceptive implant. This is a thin, plastic tube containing progesterone hormone. It does not contain estrogen hormone. Your health care provider inserts the tube in the inner part of the upper arm. The tube can remain in place for up to 3 years. After 3 years, the implant must be removed. The implant prevents the  ovaries from releasing an egg (ovulation), thickens the cervical mucus to prevent sperm from entering the uterus, and thins the lining of the inside of the uterus.  Progesterone-only injections. These injections are given every 3 months by your health care provider to prevent pregnancy. This synthetic progesterone hormone stops the ovaries from releasing eggs. It also thickens cervical mucus and changes the uterine lining. This makes it harder for sperm to survive in the uterus.  Birth control pills. These pills contain estrogen and progesterone hormone. They work by preventing the ovaries from releasing eggs (ovulation). They also cause the cervical mucus to thicken, preventing the sperm from entering the uterus. Birth control pills are prescribed by a health care provider.Birth control pills can also be used to treat heavy periods.  Minipill. This type of birth control pill contains only the progesterone hormone. They are taken every day of each month and must be prescribed by your health care provider.  Birth control patch. The patch contains hormones similar to those in birth control pills. It must be changed once a week and is prescribed by a health care provider.  Vaginal ring. The ring contains hormones similar to those in birth control pills. It is left in the vagina for 3 weeks, removed for 1 week, and then a new one is put back in place. The patient must be comfortable inserting and removing the ring from the vagina.A health care provider's prescription is necessary.  Emergency contraception. Emergency contraceptives prevent pregnancy after unprotected sexual intercourse. This pill can be taken right after sex or up to 5 days after unprotected sex. It is most effective the sooner you take the pills after having sexual intercourse. Most emergency contraceptive pills are available without a prescription. Check with your pharmacist. Do not use emergency contraception as your only form of birth  control. Barrier methods  Female condom. This is a thin sheath (latex or rubber) that is worn over the penis during sexual intercourse. It can be used with spermicide to increase effectiveness.  Female condom. This is a soft, loose-fitting sheath that is put into the vagina before sexual intercourse.  Diaphragm. This is a soft, latex, dome-shaped barrier that must be fitted by a health care provider. It is inserted into the vagina, along with a spermicidal jelly. It is inserted before intercourse. The diaphragm should be left in the vagina for 6 to 8 hours after intercourse.  Cervical cap. This is a round, soft, latex or plastic cup that fits over the cervix and must be fitted by a health care provider. The cap can be left in place for up to 48 hours after intercourse.  Sponge. This is a soft, circular piece of polyurethane foam. The sponge has spermicide in it. It is inserted into the vagina after wetting it and before sexual intercourse.  Spermicides. These are chemicals that kill or block sperm from entering the cervix and uterus. They come in the form of creams, jellies, suppositories, foam, or tablets. They do not require a prescription. They are inserted into the vagina with an applicator before having sexual intercourse. The process   must be repeated every time you have sexual intercourse. Intrauterine contraception  Intrauterine device (IUD). This is a T-shaped device that is put in a woman's uterus during a menstrual period to prevent pregnancy. There are 2 types: ? Copper IUD. This type of IUD is wrapped in copper wire and is placed inside the uterus. Copper makes the uterus and fallopian tubes produce a fluid that kills sperm. It can stay in place for 10 years. ? Hormone IUD. This type of IUD contains the hormone progestin (synthetic progesterone). The hormone thickens the cervical mucus and prevents sperm from entering the uterus, and it also thins the uterine lining to prevent  implantation of a fertilized egg. The hormone can weaken or kill the sperm that get into the uterus. It can stay in place for 3-5 years, depending on which type of IUD is used. Permanent methods of contraception  Female tubal ligation. This is when the woman's fallopian tubes are surgically sealed, tied, or blocked to prevent the egg from traveling to the uterus.  Hysteroscopic sterilization. This involves placing a small coil or insert into each fallopian tube. Your doctor uses a technique called hysteroscopy to do the procedure. The device causes scar tissue to form. This results in permanent blockage of the fallopian tubes, so the sperm cannot fertilize the egg. It takes about 3 months after the procedure for the tubes to become blocked. You must use another form of birth control for these 3 months.  Female sterilization. This is when the female has the tubes that carry sperm tied off (vasectomy).This blocks sperm from entering the vagina during sexual intercourse. After the procedure, the man can still ejaculate fluid (semen). Natural planning methods  Natural family planning. This is not having sexual intercourse or using a barrier method (condom, diaphragm, cervical cap) on days the woman could become pregnant.  Calendar method. This is keeping track of the length of each menstrual cycle and identifying when you are fertile.  Ovulation method. This is avoiding sexual intercourse during ovulation.  Symptothermal method. This is avoiding sexual intercourse during ovulation, using a thermometer and ovulation symptoms.  Post-ovulation method. This is timing sexual intercourse after you have ovulated. Regardless of which type or method of contraception you choose, it is important that you use condoms to protect against the transmission of sexually transmitted infections (STIs). Talk with your health care provider about which form of contraception is most appropriate for you. This information is not  intended to replace advice given to you by your health care provider. Make sure you discuss any questions you have with your health care provider. Document Released: 09/09/2005 Document Revised: 02/15/2016 Document Reviewed: 03/04/2013 Elsevier Interactive Patient Education  2017 Elsevier Inc.  

## 2017-06-16 NOTE — Progress Notes (Signed)
   PRENATAL VISIT NOTE  Subjective:  Shelby Woods is a 21 y.o. G2P0010 at [redacted]w[redacted]d being seen today for ongoing prenatal care.  She is currently monitored for the following issues for this low-risk pregnancy and has Supervision of normal first teen pregnancy and Fetal cardiac echogenic focus on her problem list.  Patient reports no contractions, no cramping, no leaking and vaginal irritation.  Contractions: Not present. Vag. Bleeding: Scant.  Movement: Present. Denies leaking of fluid.   The following portions of the patient's history were reviewed and updated as appropriate: allergies, current medications, past family history, past medical history, past social history, past surgical history and problem list. Problem list updated.  Objective:   Vitals:   06/16/17 1448  BP: 113/75  Pulse: 88  Weight: 130 lb 6.4 oz (59.1 kg)    Fetal Status: Fetal Heart Rate (bpm): 156; doppler Fundal Height: 32 cm Movement: Present  Presentation: Vertex  General:  Alert, oriented and cooperative. Patient is in no acute distress.  Skin: Skin is warm and dry. No rash noted.   Cardiovascular: Normal heart rate noted  Respiratory: Normal respiratory effort, no problems with respiration noted  Abdomen: Soft, gravid, appropriate for gestational age.  Pain/Pressure: Present     Pelvic: Cervical exam performed Dilation: Closed Effacement (%): 50 Station: -3  Extremities: Normal range of motion.  Edema: None  Mental Status:  Normal mood and affect. Normal behavior. Normal judgment and thought content.   Assessment and Plan:  Pregnancy: G2P0010 at [redacted]w[redacted]d  1. Encounter for supervision of normal pregnancy in teen primigravida, antepartum     Doing well  2. Yeast infection of the vagina      - fluconazole (DIFLUCAN) 150 MG tablet; Take 1 tablet (150 mg total) by mouth once.  Dispense: 1 tablet; Refill: 0  Preterm labor symptoms and general obstetric precautions including but not limited to vaginal bleeding,  contractions, leaking of fluid and fetal movement were reviewed in detail with the patient. Please refer to After Visit Summary for other counseling recommendations.  Return in about 1 week (around 06/23/2017) for ROB, GBS.   Roe Coombs, CNM

## 2017-06-16 NOTE — Addendum Note (Signed)
Addended by: Natale Milch D on: 06/16/2017 03:36 PM   Modules accepted: Orders

## 2017-06-16 NOTE — Progress Notes (Signed)
Patient reports good fetal movement and states that when she went to the bathroom this morning, she had a dark bloody discharge, pt states that it has stopped since since.

## 2017-06-18 LAB — CERVICOVAGINAL ANCILLARY ONLY
Bacterial vaginitis: NEGATIVE
Candida vaginitis: NEGATIVE
Chlamydia: NEGATIVE
Neisseria Gonorrhea: NEGATIVE
Trichomonas: NEGATIVE

## 2017-06-23 ENCOUNTER — Ambulatory Visit (INDEPENDENT_AMBULATORY_CARE_PROVIDER_SITE_OTHER): Payer: Medicaid Other | Admitting: Certified Nurse Midwife

## 2017-06-23 VITALS — BP 107/66 | HR 88 | Wt 131.0 lb

## 2017-06-23 DIAGNOSIS — B373 Candidiasis of vulva and vagina: Secondary | ICD-10-CM

## 2017-06-23 DIAGNOSIS — Z3403 Encounter for supervision of normal first pregnancy, third trimester: Secondary | ICD-10-CM | POA: Diagnosis not present

## 2017-06-23 DIAGNOSIS — B3731 Acute candidiasis of vulva and vagina: Secondary | ICD-10-CM

## 2017-06-23 MED ORDER — FLUCONAZOLE 150 MG PO TABS: 150.0000 mg | ORAL_TABLET | Freq: Once | ORAL | 0 refills | Status: AC

## 2017-06-23 NOTE — Progress Notes (Signed)
Pt states that she has been having severe back pain, waking her at night. Pt made aware of home comfort measures to try. Pt is occasionally wearing support belt.

## 2017-06-23 NOTE — Progress Notes (Signed)
   PRENATAL VISIT NOTE  Subjective:  Shelby Woods is a 21 y.o. G2P0010 at [redacted]w[redacted]d being seen today for ongoing prenatal care.  She is currently monitored for the following issues for this low-risk pregnancy and has Supervision of normal first teen pregnancy and Fetal cardiac echogenic focus on her problem list.  Patient reports no complaints.  Contractions: Not present. Vag. Bleeding: None.  Movement: Present. Denies leaking of fluid.   The following portions of the patient's history were reviewed and updated as appropriate: allergies, current medications, past family history, past medical history, past social history, past surgical history and problem list. Problem list updated.  Objective:   Vitals:   06/23/17 1508  BP: 107/66  Pulse: 88  Weight: 131 lb (59.4 kg)    Fetal Status: Fetal Heart Rate (bpm): 150; doppler Fundal Height: 35 cm Movement: Present  Presentation: Vertex  General:  Alert, oriented and cooperative. Patient is in no acute distress.  Skin: Skin is warm and dry. No rash noted.   Cardiovascular: Normal heart rate noted  Respiratory: Normal respiratory effort, no problems with respiration noted  Abdomen: Soft, gravid, appropriate for gestational age.  Pain/Pressure: Present     Pelvic: Cervical exam performed Dilation: 1 Effacement (%): 60 Station: -2  Extremities: Normal range of motion.     Mental Status:  Normal mood and affect. Normal behavior. Normal judgment and thought content.   Assessment and Plan:  Pregnancy: G2P0010 at [redacted]w[redacted]d  1. Supervision of normal first teen pregnancy in third trimester      Doing well.  - Strep Gp B NAA  2. Yeast vaginitis       - fluconazole (DIFLUCAN) 150 MG tablet; Take 1 tablet (150 mg total) by mouth once.  Dispense: 1 tablet; Refill: 0  Preterm labor symptoms and general obstetric precautions including but not limited to vaginal bleeding, contractions, leaking of fluid and fetal movement were reviewed in detail with the  patient. Please refer to After Visit Summary for other counseling recommendations.  Return in about 1 week (around 06/30/2017) for ROB.   Roe Coombs, CNM

## 2017-06-25 ENCOUNTER — Other Ambulatory Visit: Payer: Self-pay | Admitting: Certified Nurse Midwife

## 2017-06-25 DIAGNOSIS — Z3403 Encounter for supervision of normal first pregnancy, third trimester: Secondary | ICD-10-CM

## 2017-06-25 LAB — STREP GP B NAA: Strep Gp B NAA: NEGATIVE

## 2017-06-30 ENCOUNTER — Encounter: Payer: Medicaid Other | Admitting: Certified Nurse Midwife

## 2017-07-21 DIAGNOSIS — Z3483 Encounter for supervision of other normal pregnancy, third trimester: Secondary | ICD-10-CM

## 2017-10-16 ENCOUNTER — Encounter (HOSPITAL_COMMUNITY): Payer: Self-pay

## 2019-01-08 IMAGING — US US MFM OB FOLLOW-UP
1 series · 13 of 28 positions shown · non-contrast
Comparison: none

Performed By:     Shayla Jumper          Secondary Phy.:   [REDACTED]
for [REDACTED]care ([HOSPITAL])

1  JABERI           [id number]      [id number]     [id number]
2  NAZARETH JUMPER           [id number]      [id number]     [id number]
Indications
23 weeks gestation of pregnancy
Abnormal ultrasound finding on antenatal
screening of mother (EIF)
Encounter for other antenatal screening
follow-up
Encounter for cervical length
OB History
Blood Type:            Height:  5'2"   Weight (lb):  116      BMI:
Gravidity:    1         Term:   0        Prem:   0        SAB:   0
TOP:          0       Ectopic:  0        Living: 0
Fetal Evaluation
Num Of Fetuses:     1
Fetal Heart         142
Rate(bpm):
Cardiac Activity:   Observed
Presentation:       Cephalic
Placenta:           Posterior, above cervical os
P. Cord Insertion:  Visualized, central
Amniotic Fluid
AFI FV:      Subjectively within normal limits
Largest Pocket(cm)
5.33
Biometry
BPD:      54.8  mm     G. Age:  22w 5d         12  %    CI:        73.12   %   70 - 86
FL/HC:      19.4   %   18.7 -
HC:      203.7  mm     G. Age:  22w 3d          5  %    HC/AC:      1.11       1.05 -
AC:      182.7  mm     G. Age:  23w 0d         23  %    FL/BPD:     72.1   %   71 - 87
FL:       39.5  mm     G. Age:  22w 5d         12  %    FL/AC:      21.6   %   20 - 24
Est. FW:     543  gm      1 lb 3 oz     33  %
Gestational Age
LMP:           23w 5d       Date:   10/17/16                 EDD:   07/24/17
U/S Today:     22w 5d                                        EDD:   07/31/17
Best:          23w 5d    Det. By:   LMP  (10/17/16)          EDD:   07/24/17
Anatomy
Cranium:               Appears normal         Aortic Arch:            Appears normal
Cavum:                 Not well visualized    Ductal Arch:            Appears normal
Ventricles:            Appears normal         Diaphragm:              Appears normal
Choroid Plexus:        Previously seen        Stomach:                Appears normal, left
sided
Cerebellum:            Previously seen        Abdomen:                Appears normal
Posterior Fossa:       Previously seen        Abdominal Wall:         Appears nml (cord
insert, abd wall)
Nuchal Fold:           Previously seen        Cord Vessels:           Appears normal (3
vessel cord)
Face:                  Orbits and profile     Kidneys:                Appear normal
previously seen
Lips:                  Previously seen        Bladder:                Appears normal
Thoracic:              Appears normal         Spine:                  Previously seen
Heart:                 Echogenic focus        Upper Extremities:      Previously
in LV
seen,hands nws
RVOT:                  Previously seen        Lower Extremities:      Previously seen
LVOT:                  Previously seen
Other:  Fetus appears to be a female. Heels previously seen. Nasal bone
previously seen.Technically difficult due to fetal position.
Cervix Uterus Adnexa
Cervix
Length:           4.17  cm.
Appears closed, without funnelling.
Uterus
No abnormality visualized.
Left Ovary
Within normal limits.
Right Ovary
Adnexa:       No abnormality visualized.
Impression
INDICATION: 20 yr old RHOCCPC at 27w2d for follow up
ultrasound to reevaluate fetal anatomic survey. Remote read.

[Series 1: us mfm ob follow-up · 74 acquisitions, 13 frames shown]
[im 3/74]
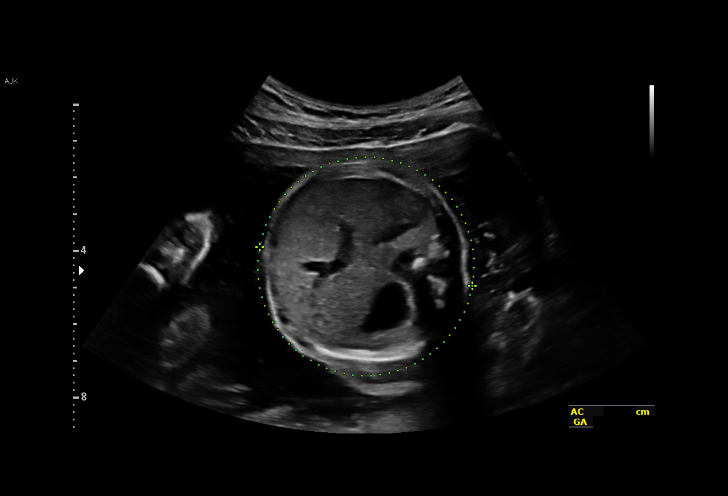
[im 9/74]
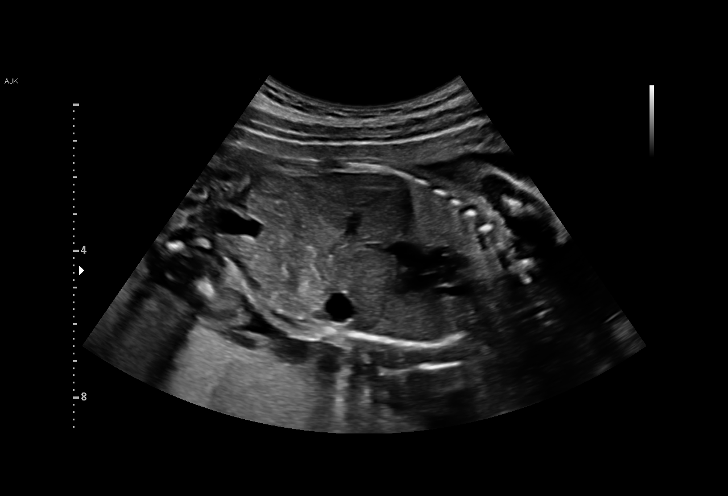
[im 14/74]
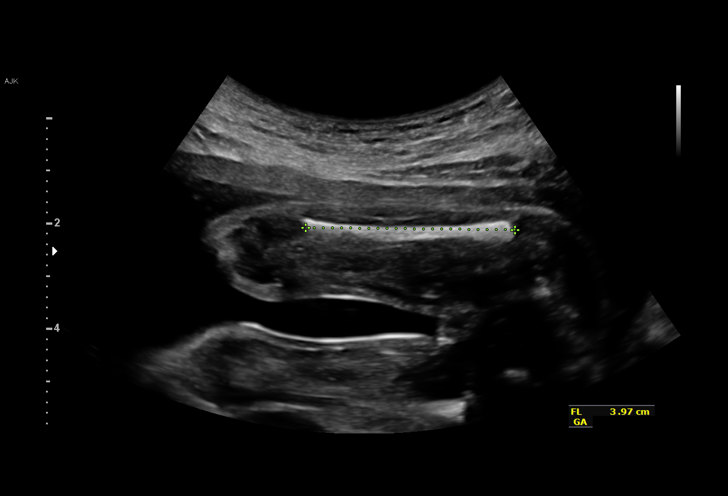
[im 19/74]
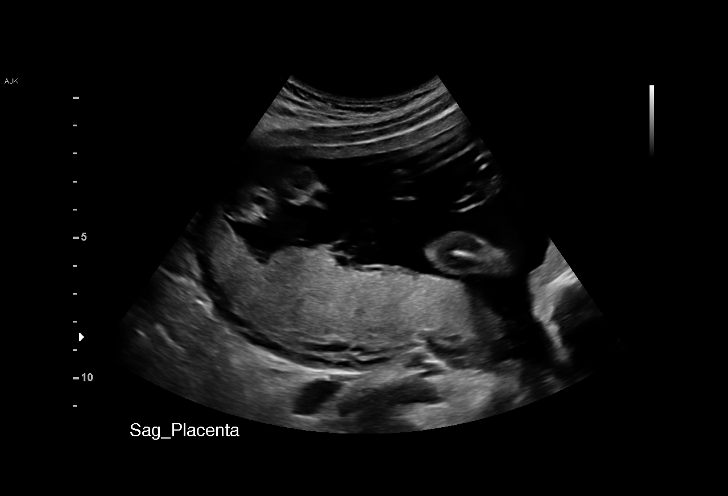
[im 25/74]
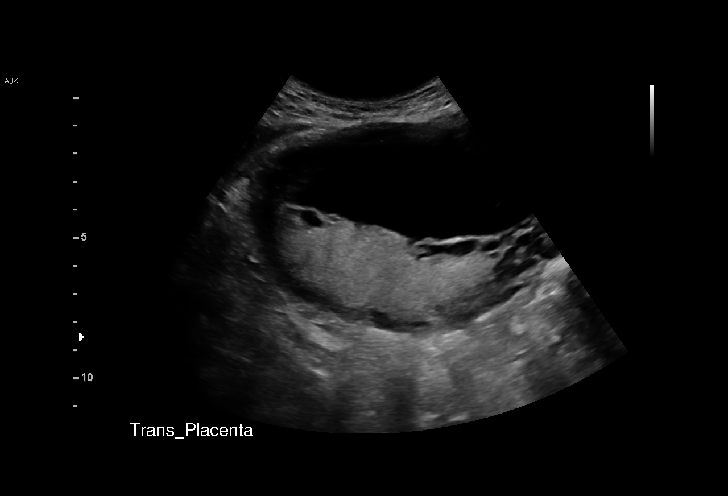
[im 30/74]
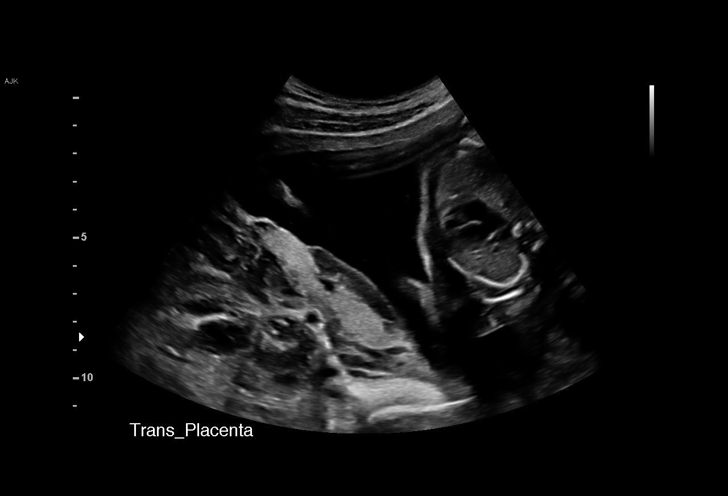
[im 38/74]
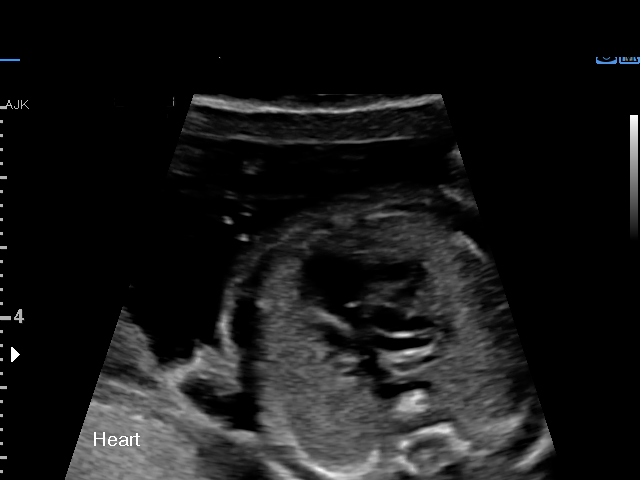
[im 44/74]
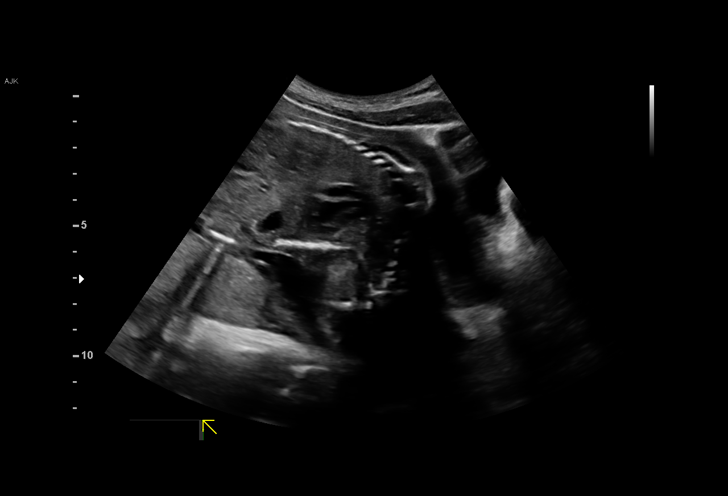
[im 49/74]
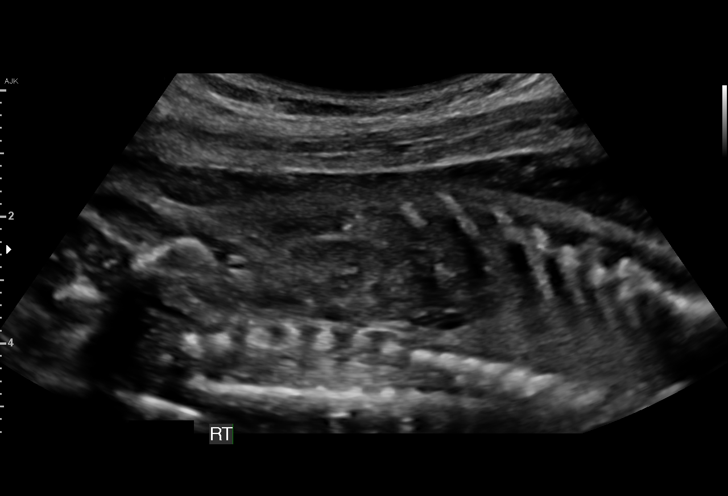
[im 55/74]
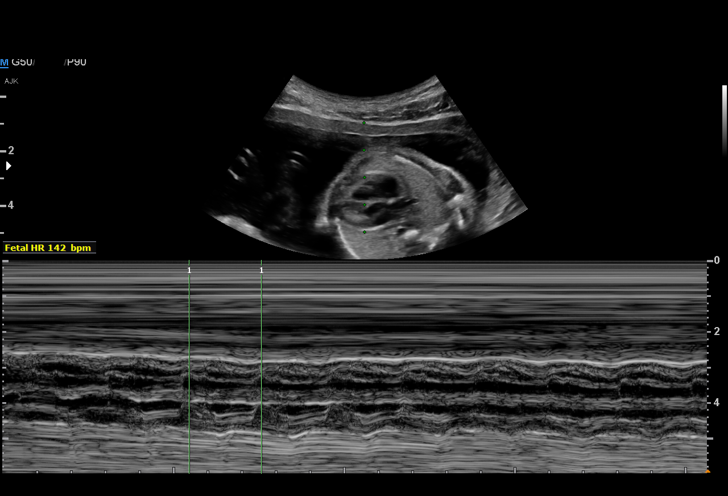
[im 60/74]
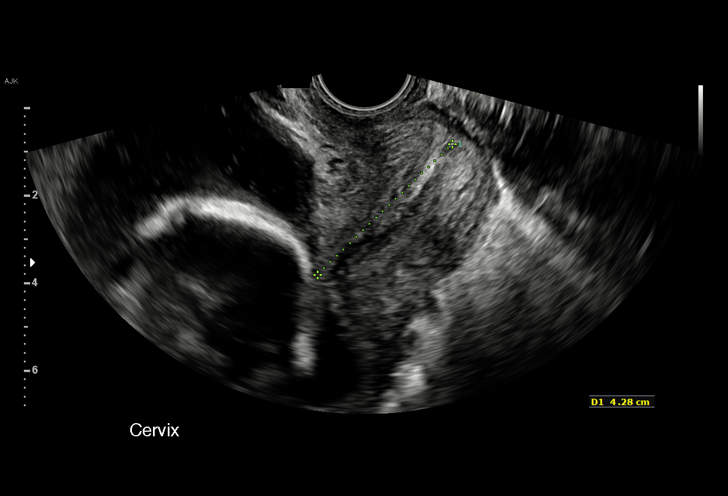
[im 65/74]
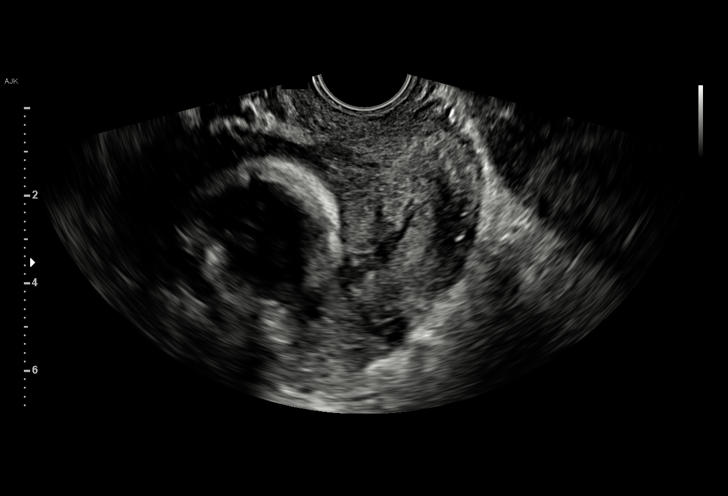
[im 71/74]
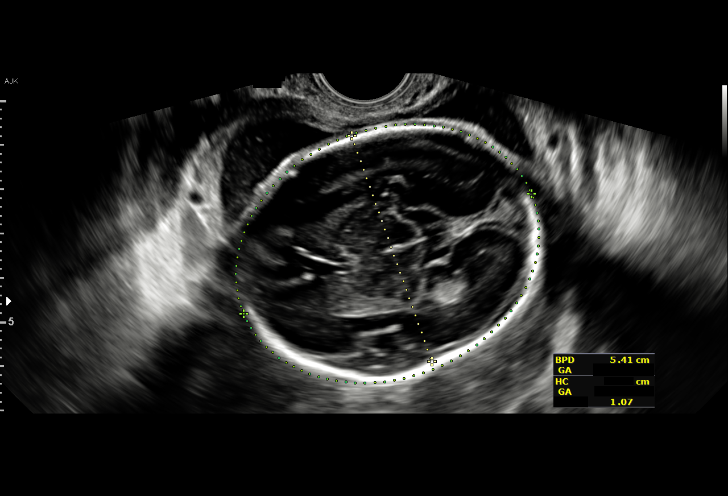

[13 of 28 positions shown; findings below may reference images not displayed]

FINDINGS: 1. Single intrauterine pregnancy.
2. Estimated fetal weight is in the 33rd%/
3. Posterior placenta without evidence of previa.
4. Normal amniotic fluid volume.
5. Normal transvaginal cervical length.
6. There is an echogenic focus in the left ventricle.
7. The views of the cavum and hands remain limited.
8. The remainder of the limited anatomy survey is normal;
any anatomy not evaluated on today's exam was evaluated
on the previous exam.
Recommendations

1. Appropriate fetal growth.
2. Limited anatomy survey:
- recommend follow up in 4 weeks to reevaluate fetal anatomy
- cannot determine if cavum is absent or just hard to visualize
due to fetal position; recommend follow up with MFM so we
may scan the patient
3. Echogenic cardiac focus:
- patient not counseled as this is a remote read
- quad screen normal
- risk of trisomy 21 is not significantly increased if isolated in
the context of low risk aneuploidy screening

## 2019-02-15 IMAGING — US US MFM OB FOLLOW-UP
1 series · 14 of 28 positions shown · non-contrast
Comparison: none

[Series 1: us mfm ob follow-up · 60 acquisitions, 14 frames shown]
[im 3/60]
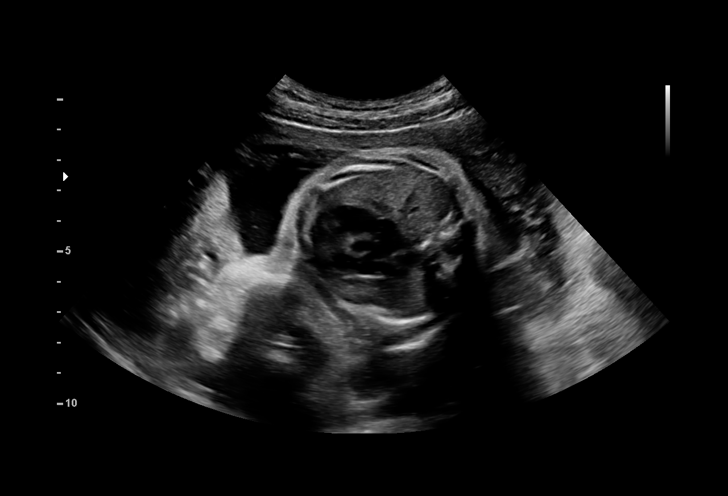
[im 7/60]
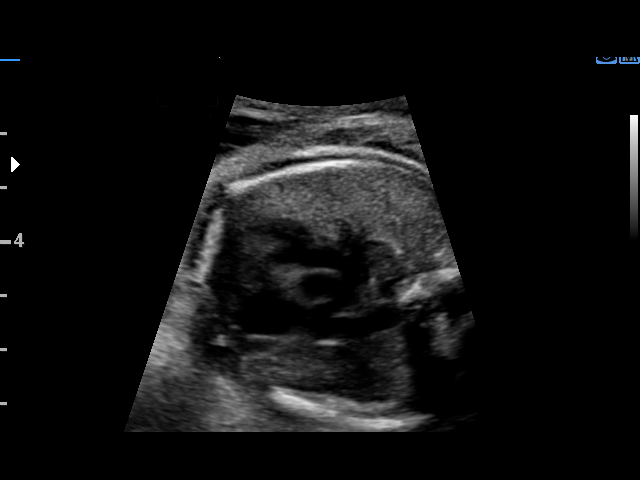
[im 11/60]
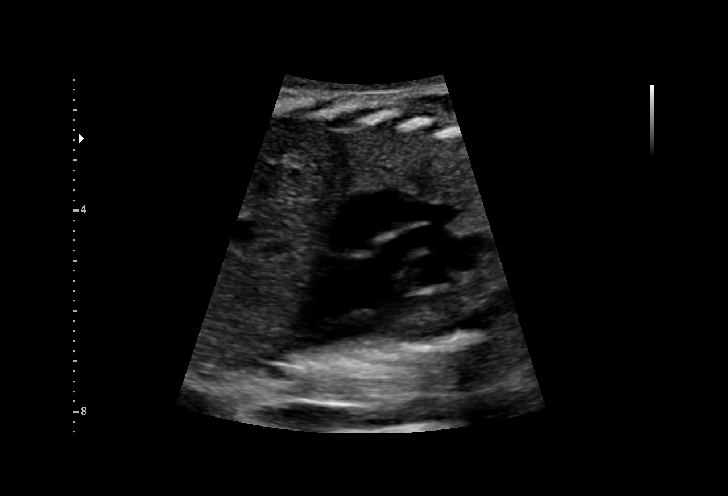
[im 16/60]
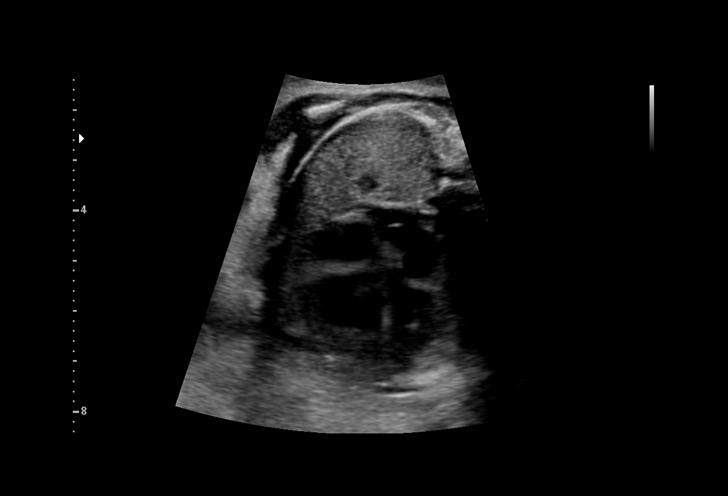
[im 20/60]
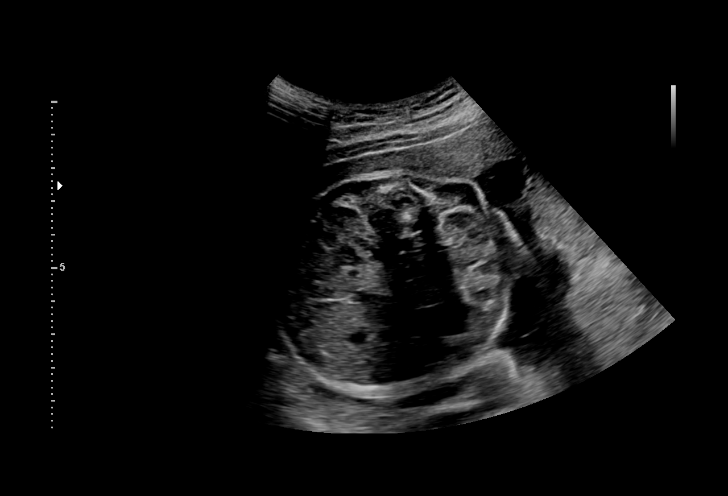
[im 25/60]
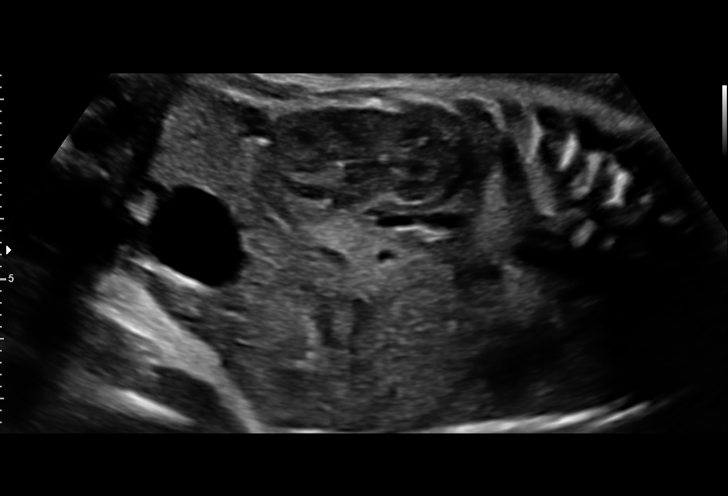
[im 29/60]
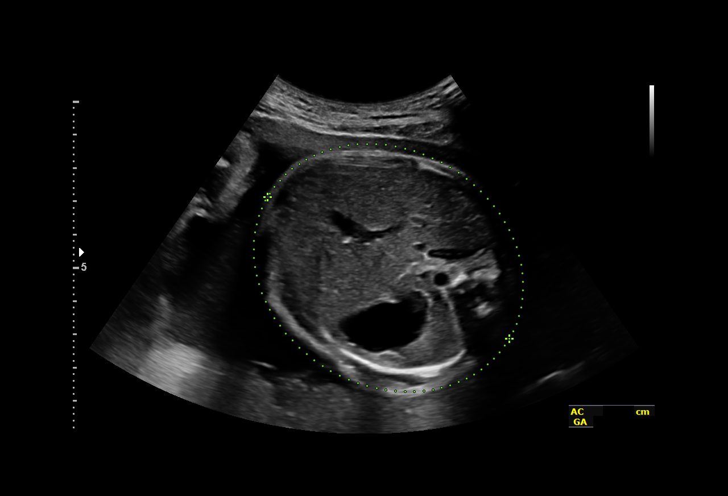
[im 33/60]
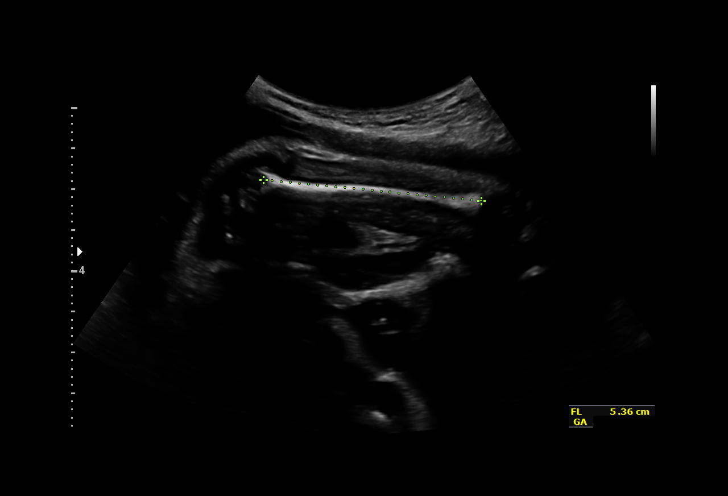
[im 38/60]
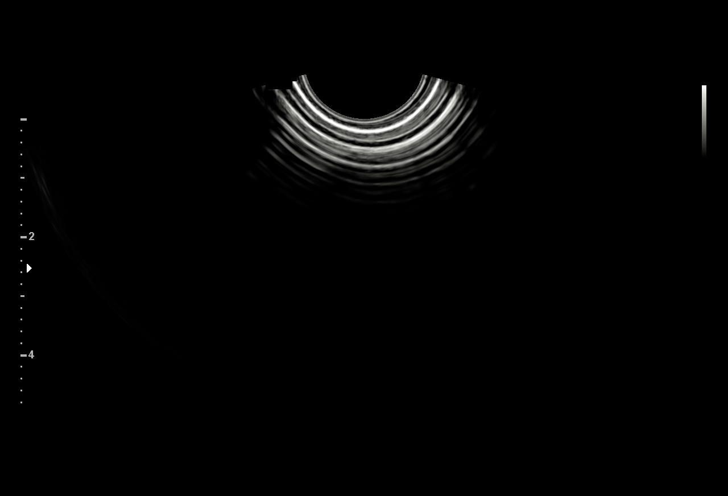
[im 42/60]
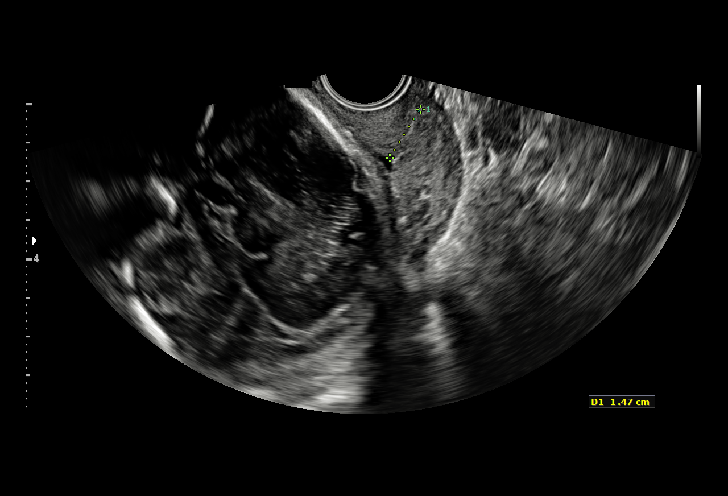
[im 46/60]
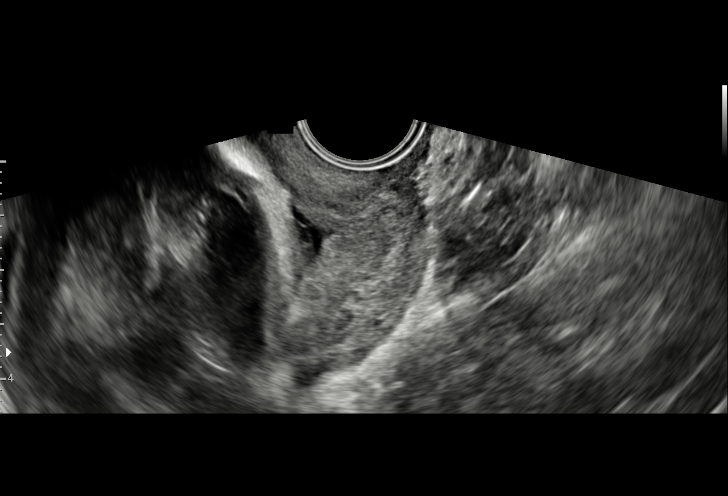
[im 51/60]
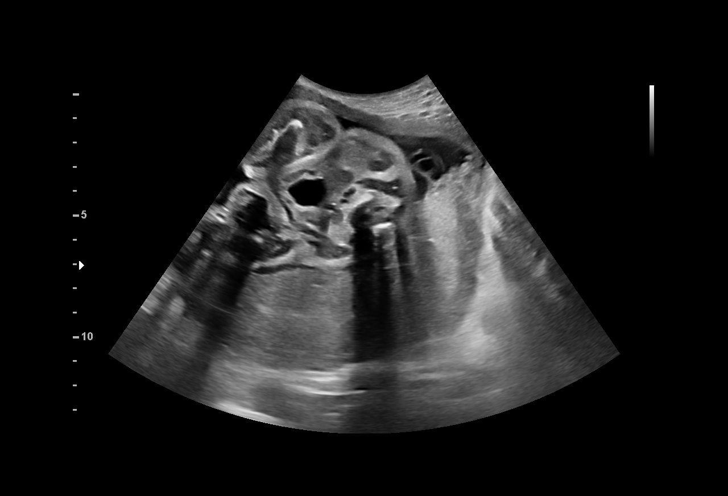
[im 55/60]
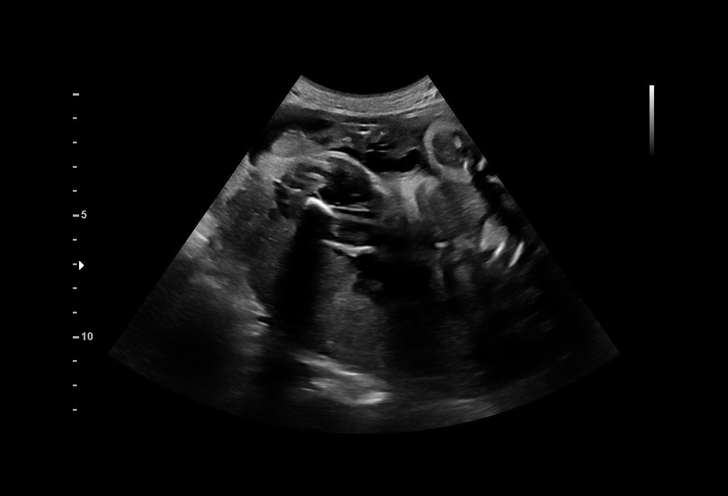
[im 60/60]
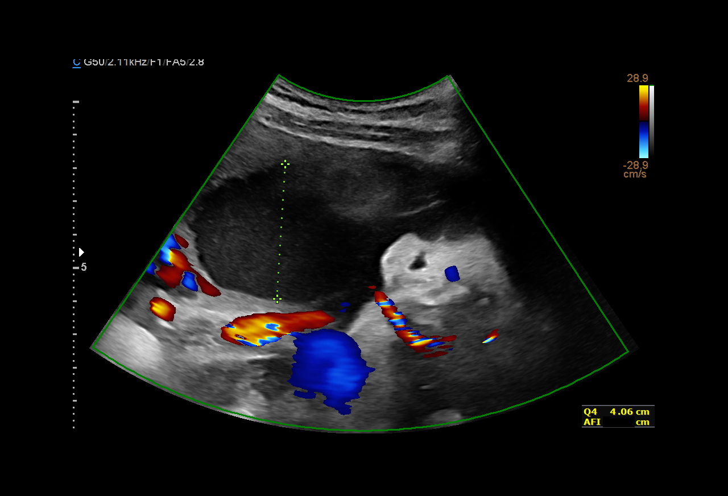

[14 of 28 positions shown; findings below may reference images not displayed]

pm)

[facility]
[REDACTED]care -
[HOSPITAL]([HOSPITAL]
)

1  [facility]            [id number]      [id number]     [id number]
2  PAYAM BULLOCK              [id number]      [id number]     [id number]
Indications

29 weeks gestation of pregnancy
Abnormal ultrasound finding on antenatal
screening of mother (EIF)
Antenatal follow-up for nonvisualized fetal
anatomy
OB History

Blood Type:            Height:  5'2"   Weight (lb):  116      BMI:
Gravidity:    1         Term:   0        Prem:   0        SAB:   0
TOP:          0       Ectopic:  0        Living: 0
Fetal Evaluation

Num Of Fetuses:     1
Fetal Heart         137
Rate(bpm):
Cardiac Activity:   Observed
Presentation:       Cephalic
Placenta:           Posterior, above cervical os
P. Cord Insertion:  Previously Visualized
Amniotic Fluid
AFI FV:      Subjectively within normal limits

AFI Sum(cm)     %Tile       Largest Pocket(cm)
12.24           31

RUQ(cm)       RLQ(cm)       LUQ(cm)        LLQ(cm)
1.8
Biometry

BPD:      70.1  mm     G. Age:  28w 1d         12  %    CI:        80.13   %   70 - 86
FL/HC:      21.7   %   19.6 -
HC:      247.4  mm     G. Age:  26w 6d        < 3  %    HC/AC:      1.03       0.99 -
AC:      240.5  mm     G. Age:  28w 2d         22  %    FL/BPD:     76.6   %   71 - 87
FL:       53.7  mm     G. Age:  28w 3d         19  %    FL/AC:      22.3   %   20 - 24
HUM:      48.5  mm     G. Age:  28w 4d         32  %

Est. FW:    0045  gm    2 lb 10 oz      33  %
Gestational Age

LMP:           29w 1d       Date:   10/17/16                 EDD:   07/24/17
U/S Today:     28w 0d                                        EDD:   08/01/17
Best:          29w 1d    Det. By:   LMP  (10/17/16)          EDD:   07/24/17
Anatomy

Cranium:               Appears normal         Aortic Arch:            Previously seen
Cavum:                 Not well visualized    Ductal Arch:            Previously seen
Ventricles:            Previously seen        Diaphragm:              Previously seen
Choroid Plexus:        Previously seen        Stomach:                Appears normal, left
sided
Cerebellum:            Previously seen        Abdomen:                Appears normal
Posterior Fossa:       Previously seen        Abdominal Wall:         Previously seen
Nuchal Fold:           Previously seen        Cord Vessels:           Previously seen
Face:                  Orbits and profile     Kidneys:                Appear normal
previously seen
Lips:                  Previously seen        Bladder:                Appears normal
Thoracic:              Appears normal         Spine:                  Previously seen
Heart:                 Echogenic focus        Upper Extremities:      Previously
in LV
seen,hands nws
RVOT:                  Appears normal         Lower Extremities:      Previously seen
LVOT:                  Appears normal

Other:  Fetus appears to be a female. Heels previously seen. Nasal bone
previously seen.Technically difficult due to fetal position.
Cervix Uterus Adnexa

Cervix
Length:            1.4  cm.
Measured transvaginally.
Impression

Singleton intrauterine pregnancy at 29+1 weeks
Interval review of the anatomy shows no sonographic
markers for aneuploidy or structural anomalies
The previously noted EIF is still present and appears
nonpathologic
Amniotic fluid volume is normal
Estimated fetal weight is 1192g which is growth in the 33rd
percentile
Cervical length was obtained during evaluation of the
intracranial anatomy. It is 1.4cm, but at this EGA that is an
uninterpretable finding
Recommendations

Follow-up ultrasounds as clinically indicated.
